# Patient Record
Sex: Female | Born: 1935 | Race: White | Hispanic: No | Smoking: Never smoker
Health system: Southern US, Community
[De-identification: ages and names within clinical notes are randomized; demographics above are authoritative.]

## PROBLEM LIST (undated history)

## (undated) DIAGNOSIS — I739 Peripheral vascular disease, unspecified: Secondary | ICD-10-CM

## (undated) DIAGNOSIS — E785 Hyperlipidemia, unspecified: Secondary | ICD-10-CM

## (undated) DIAGNOSIS — K219 Gastro-esophageal reflux disease without esophagitis: Secondary | ICD-10-CM

## (undated) DIAGNOSIS — I10 Essential (primary) hypertension: Secondary | ICD-10-CM

## (undated) DIAGNOSIS — I499 Cardiac arrhythmia, unspecified: Secondary | ICD-10-CM

## (undated) HISTORY — PX: ABDOMINAL HYSTERECTOMY: SHX81

## (undated) HISTORY — PX: CHOLECYSTECTOMY: SHX55

## (undated) HISTORY — PX: COLONOSCOPY: SHX174

---

## 2005-06-22 ENCOUNTER — Ambulatory Visit: Payer: Self-pay | Admitting: Gastroenterology

## 2005-06-30 ENCOUNTER — Ambulatory Visit: Payer: Self-pay | Admitting: Gastroenterology

## 2005-07-21 ENCOUNTER — Ambulatory Visit: Payer: Self-pay | Admitting: General Surgery

## 2005-09-20 ENCOUNTER — Ambulatory Visit: Payer: Self-pay | Admitting: Ophthalmology

## 2005-09-22 ENCOUNTER — Ambulatory Visit: Payer: Self-pay | Admitting: Ophthalmology

## 2006-01-12 ENCOUNTER — Ambulatory Visit: Payer: Self-pay | Admitting: Gastroenterology

## 2006-03-16 ENCOUNTER — Ambulatory Visit: Payer: Self-pay | Admitting: Unknown Physician Specialty

## 2006-08-22 ENCOUNTER — Ambulatory Visit: Payer: Self-pay | Admitting: Family Medicine

## 2007-02-21 ENCOUNTER — Ambulatory Visit: Payer: Self-pay

## 2007-04-13 ENCOUNTER — Ambulatory Visit: Payer: Self-pay | Admitting: Neurosurgery

## 2007-10-09 ENCOUNTER — Ambulatory Visit: Payer: Self-pay | Admitting: Family Medicine

## 2008-04-01 ENCOUNTER — Ambulatory Visit: Payer: Self-pay | Admitting: Family Medicine

## 2008-06-24 ENCOUNTER — Ambulatory Visit: Payer: Self-pay | Admitting: Internal Medicine

## 2008-09-07 ENCOUNTER — Ambulatory Visit: Payer: Self-pay | Admitting: Internal Medicine

## 2008-09-12 ENCOUNTER — Ambulatory Visit: Payer: Self-pay | Admitting: Family Medicine

## 2008-09-13 ENCOUNTER — Ambulatory Visit: Payer: Self-pay | Admitting: Family Medicine

## 2008-10-10 ENCOUNTER — Ambulatory Visit: Payer: Self-pay | Admitting: Family Medicine

## 2008-11-19 ENCOUNTER — Ambulatory Visit: Payer: Self-pay | Admitting: Gastroenterology

## 2009-10-13 ENCOUNTER — Ambulatory Visit: Payer: Self-pay | Admitting: Family Medicine

## 2009-12-23 ENCOUNTER — Ambulatory Visit: Payer: Self-pay | Admitting: Internal Medicine

## 2010-10-15 ENCOUNTER — Ambulatory Visit: Payer: Self-pay | Admitting: Family Medicine

## 2011-10-21 ENCOUNTER — Ambulatory Visit: Payer: Self-pay | Admitting: Family Medicine

## 2012-01-25 ENCOUNTER — Ambulatory Visit: Payer: Self-pay | Admitting: Unknown Physician Specialty

## 2012-10-24 ENCOUNTER — Ambulatory Visit: Payer: Self-pay | Admitting: Family Medicine

## 2013-06-25 ENCOUNTER — Ambulatory Visit: Payer: Self-pay | Admitting: Physical Medicine and Rehabilitation

## 2013-10-25 ENCOUNTER — Ambulatory Visit: Payer: Self-pay | Admitting: Family Medicine

## 2014-01-31 ENCOUNTER — Ambulatory Visit: Payer: Self-pay | Admitting: Gastroenterology

## 2014-04-18 DIAGNOSIS — E782 Mixed hyperlipidemia: Secondary | ICD-10-CM | POA: Insufficient documentation

## 2014-05-06 ENCOUNTER — Emergency Department: Payer: Self-pay | Admitting: Emergency Medicine

## 2014-05-06 LAB — CBC WITH DIFFERENTIAL/PLATELET
BASOS PCT: 0.9 %
Basophil #: 0.1 10*3/uL (ref 0.0–0.1)
EOS PCT: 0.9 %
Eosinophil #: 0.1 10*3/uL (ref 0.0–0.7)
HCT: 43.6 % (ref 35.0–47.0)
HGB: 14.7 g/dL (ref 12.0–16.0)
Lymphocyte #: 2.3 10*3/uL (ref 1.0–3.6)
Lymphocyte %: 32.7 %
MCH: 31.4 pg (ref 26.0–34.0)
MCHC: 33.7 g/dL (ref 32.0–36.0)
MCV: 93 fL (ref 80–100)
MONO ABS: 0.5 x10 3/mm (ref 0.2–0.9)
Monocyte %: 7 %
NEUTROS PCT: 58.5 %
Neutrophil #: 4.1 10*3/uL (ref 1.4–6.5)
Platelet: 246 10*3/uL (ref 150–440)
RBC: 4.68 10*6/uL (ref 3.80–5.20)
RDW: 13.4 % (ref 11.5–14.5)
WBC: 6.9 10*3/uL (ref 3.6–11.0)

## 2014-05-06 LAB — BASIC METABOLIC PANEL
Anion Gap: 13 (ref 7–16)
BUN: 16 mg/dL (ref 7–18)
Calcium, Total: 9.4 mg/dL (ref 8.5–10.1)
Chloride: 104 mmol/L (ref 98–107)
Co2: 24 mmol/L (ref 21–32)
Creatinine: 1.08 mg/dL (ref 0.60–1.30)
EGFR (Non-African Amer.): 50 — ABNORMAL LOW
GFR CALC AF AMER: 58 — AB
Glucose: 99 mg/dL (ref 65–99)
Osmolality: 282 (ref 275–301)
POTASSIUM: 4.1 mmol/L (ref 3.5–5.1)
Sodium: 141 mmol/L (ref 136–145)

## 2014-05-06 LAB — TROPONIN I: Troponin-I: <0.02 ng/mL

## 2014-05-06 LAB — URINALYSIS, COMPLETE
BILIRUBIN, UR: NEGATIVE
BLOOD: NEGATIVE
Bacteria: NONE SEEN
GLUCOSE, UR: NEGATIVE mg/dL (ref 0–75)
Ketone: NEGATIVE
Nitrite: NEGATIVE
PROTEIN: NEGATIVE
Ph: 5 (ref 4.5–8.0)
RBC,UR: NONE SEEN /HPF (ref 0–5)
Specific Gravity: 1.004 (ref 1.003–1.030)
WBC UR: 1 /HPF (ref 0–5)

## 2014-06-07 DIAGNOSIS — E538 Deficiency of other specified B group vitamins: Secondary | ICD-10-CM | POA: Insufficient documentation

## 2014-06-07 DIAGNOSIS — R7301 Impaired fasting glucose: Secondary | ICD-10-CM | POA: Insufficient documentation

## 2014-07-05 DIAGNOSIS — M5412 Radiculopathy, cervical region: Secondary | ICD-10-CM | POA: Insufficient documentation

## 2014-10-30 ENCOUNTER — Ambulatory Visit: Payer: Self-pay | Admitting: Family Medicine

## 2014-11-08 DEATH — deceased

## 2014-11-19 DIAGNOSIS — I071 Rheumatic tricuspid insufficiency: Secondary | ICD-10-CM | POA: Insufficient documentation

## 2014-11-19 DIAGNOSIS — I340 Nonrheumatic mitral (valve) insufficiency: Secondary | ICD-10-CM | POA: Insufficient documentation

## 2015-02-28 ENCOUNTER — Ambulatory Visit
Admit: 2015-02-28 | Disposition: A | Discharge status: Home / Self Care | Payer: Self-pay | Attending: Physical Medicine and Rehabilitation | Admitting: Physical Medicine and Rehabilitation

## 2015-04-04 ENCOUNTER — Other Ambulatory Visit: Payer: Self-pay | Admitting: Family Medicine

## 2015-04-04 DIAGNOSIS — R1011 Right upper quadrant pain: Secondary | ICD-10-CM

## 2015-04-09 ENCOUNTER — Ambulatory Visit
Admission: RE | Admit: 2015-04-09 | Discharge: 2015-04-09 | Disposition: A | Discharge status: Home / Self Care | Payer: Medicare Other | Source: Ambulatory Visit | Attending: Family Medicine | Admitting: Family Medicine

## 2015-04-09 DIAGNOSIS — R140 Abdominal distension (gaseous): Secondary | ICD-10-CM | POA: Diagnosis not present

## 2015-04-09 DIAGNOSIS — R1011 Right upper quadrant pain: Secondary | ICD-10-CM

## 2015-04-09 DIAGNOSIS — R110 Nausea: Secondary | ICD-10-CM | POA: Diagnosis not present

## 2015-04-09 DIAGNOSIS — R1013 Epigastric pain: Secondary | ICD-10-CM | POA: Diagnosis not present

## 2015-04-09 HISTORY — DX: Essential (primary) hypertension: I10

## 2015-04-09 MED ORDER — IOHEXOL 300 MG/ML  SOLN
85.0000 mL | Freq: Once | INTRAMUSCULAR | Status: AC | PRN
  Administered 2015-04-09: 100 mL via INTRAVENOUS

## 2015-04-18 DIAGNOSIS — I10 Essential (primary) hypertension: Secondary | ICD-10-CM | POA: Insufficient documentation

## 2015-05-22 IMAGING — CT CT HEAD WITHOUT CONTRAST
1 series · 16 of 30 positions shown, 20 images · non-contrast
Comparison: None.

CLINICAL DATA: Dizziness worse with standing

EXAM:
CT HEAD WITHOUT CONTRAST
TECHNIQUE: Contiguous axial images were obtained from the base of the skull
through the vertex without intravenous contrast.

[Series 2: head wo · axial · 0.41mm/px · z∈[-46,+89]mm · 16 of 30 slices shown, 20 images]
[im 2/30  brain]
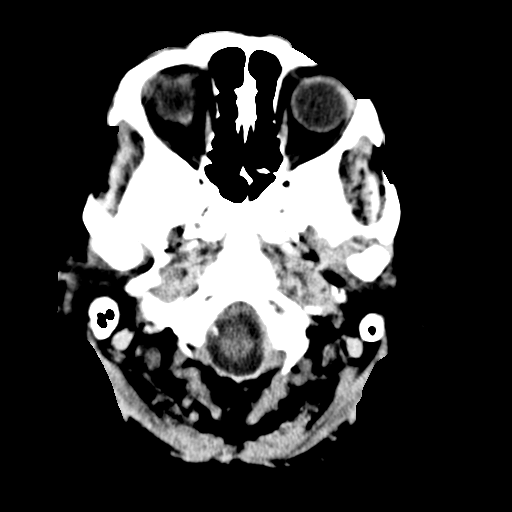
[im 2/30  bone]
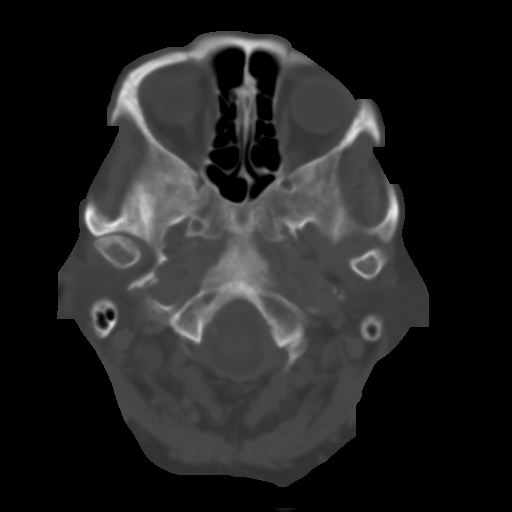
[im 4/30  brain]
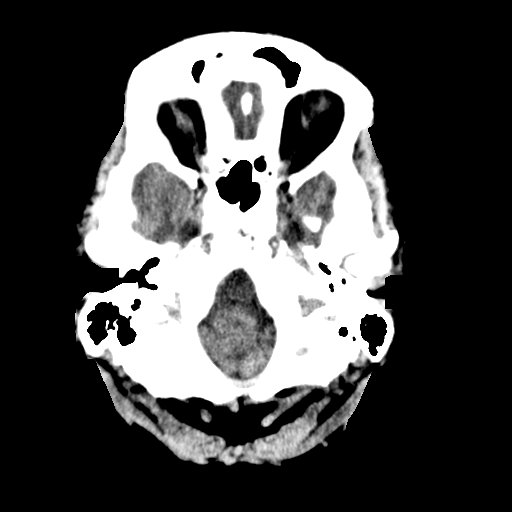
[im 6/30  brain]
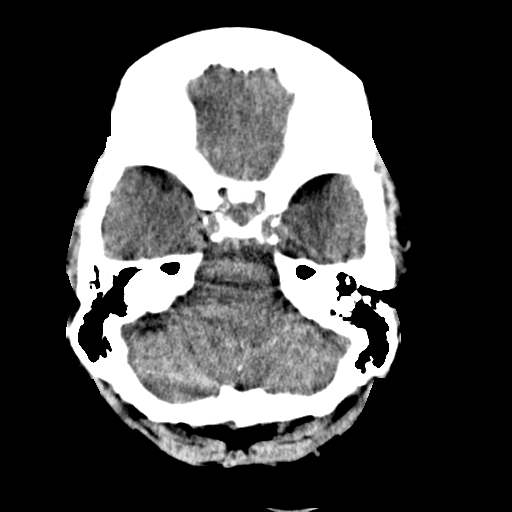
[im 8/30  brain]
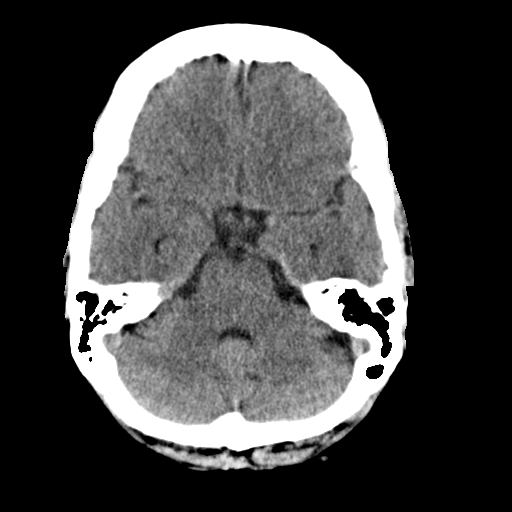
[im 9/30  brain]
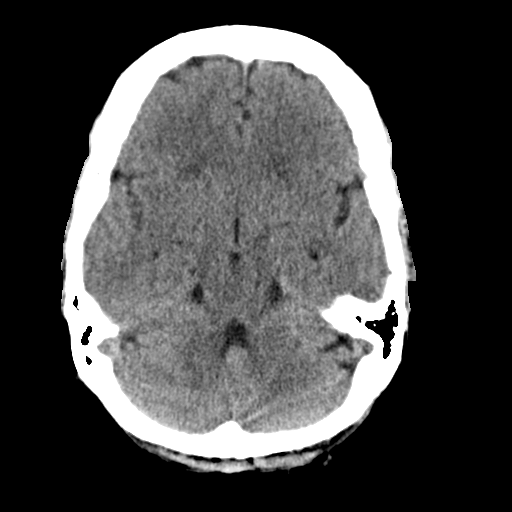
[im 9/30  bone]
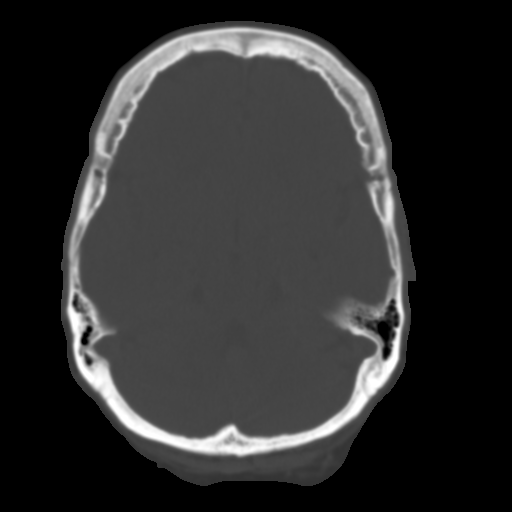
[im 11/30  brain]
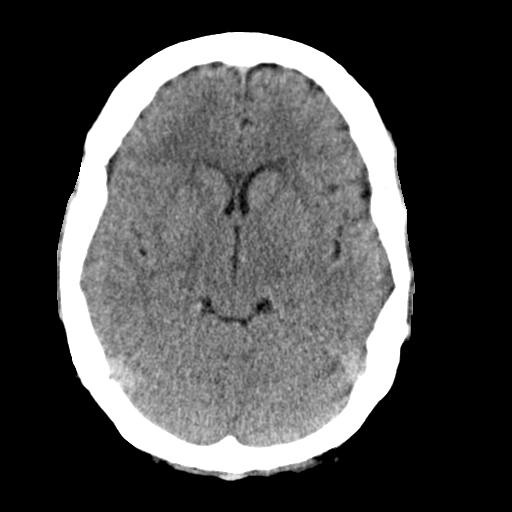
[im 13/30  brain]
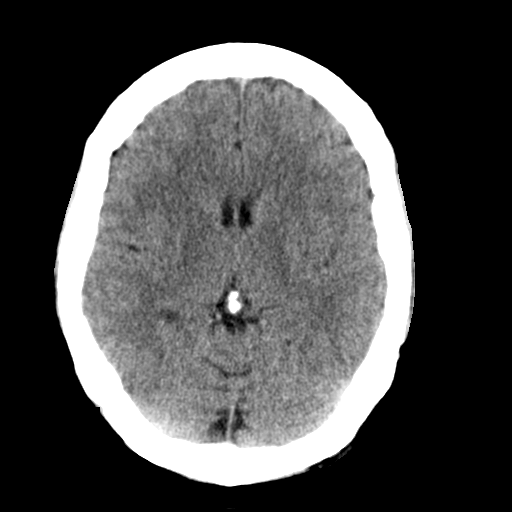
[im 15/30  brain]
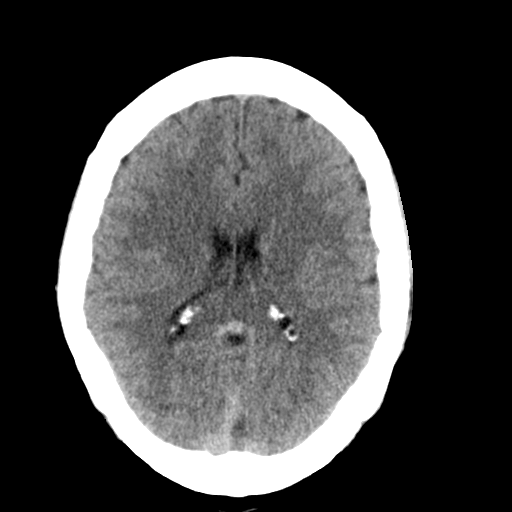
[im 16/30  brain]
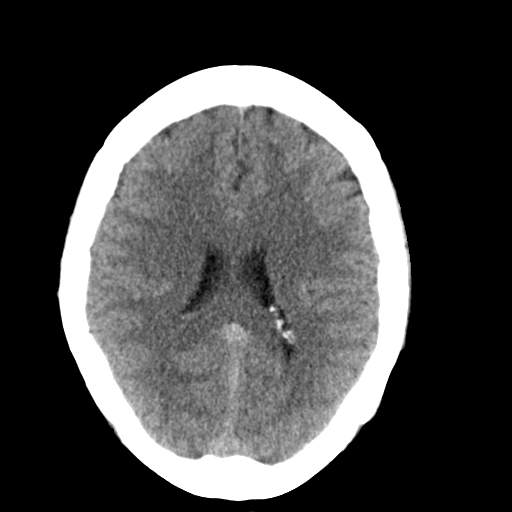
[im 16/30  bone]
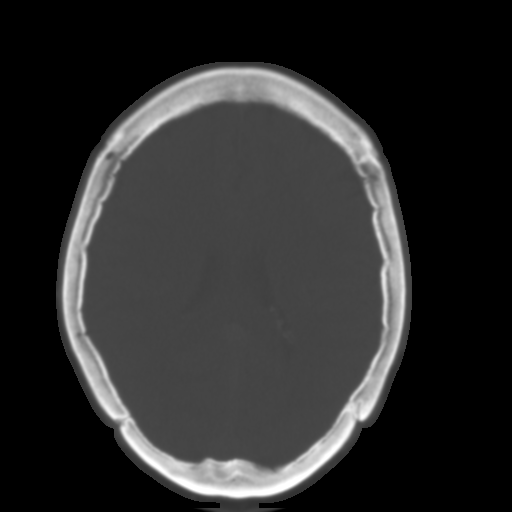
[im 18/30  brain]
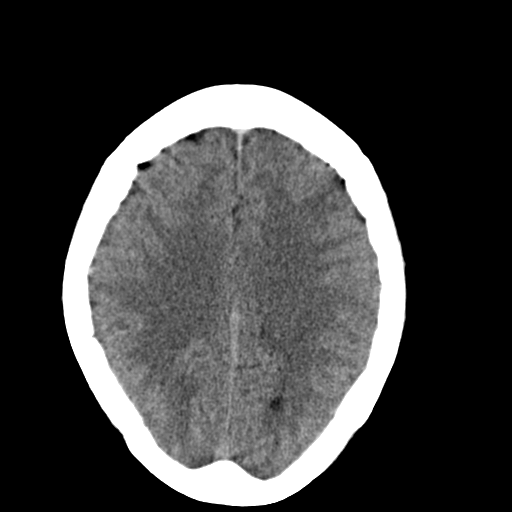
[im 20/30  brain]
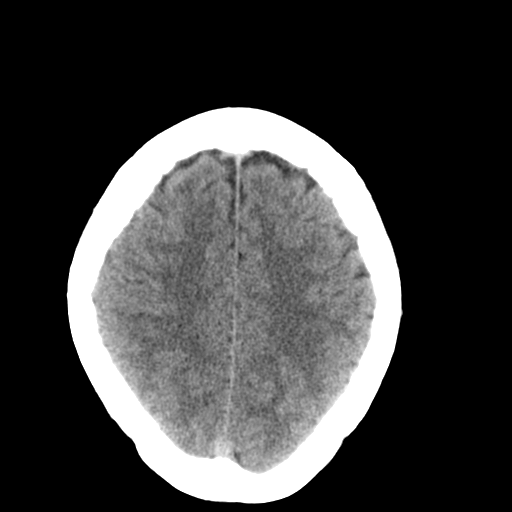
[im 22/30  brain]
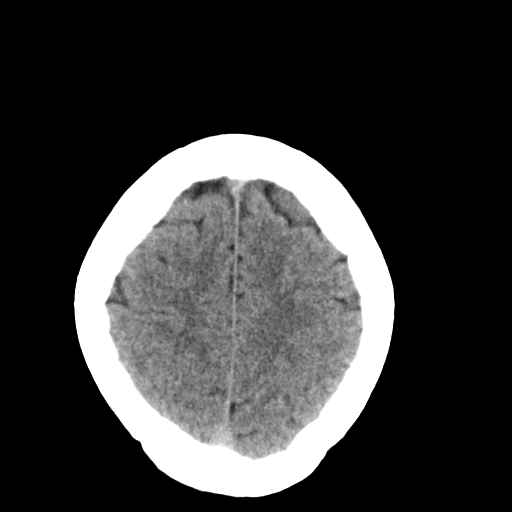
[im 23/30  brain]
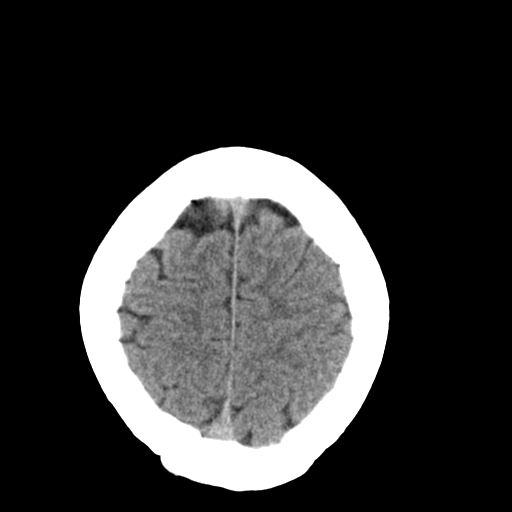
[im 23/30  bone]
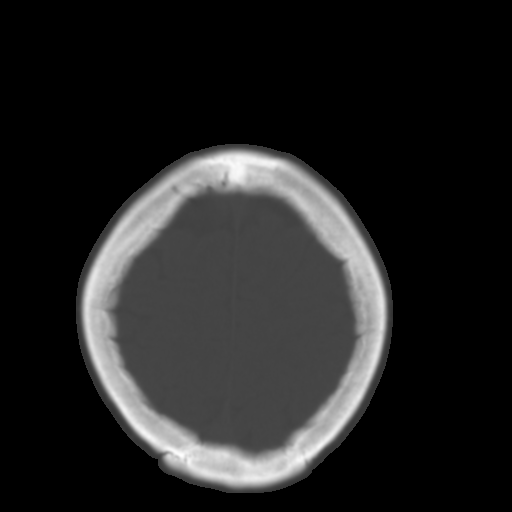
[im 25/30  brain]
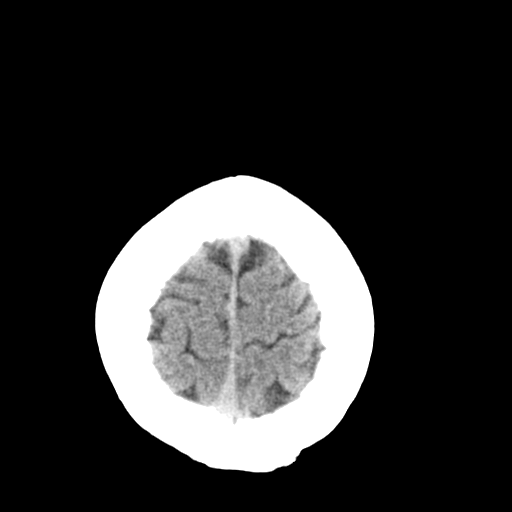
[im 27/30  brain]
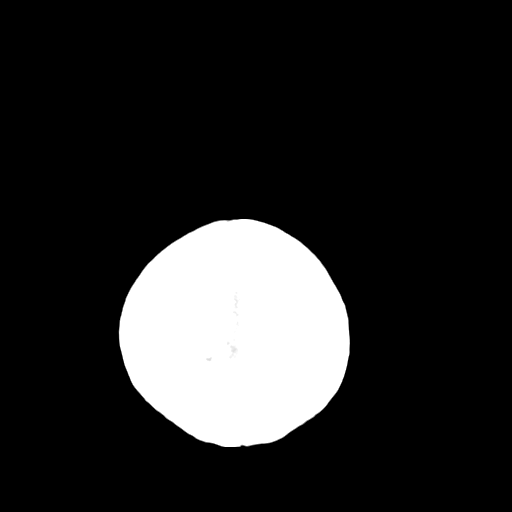
[im 29/30  brain]
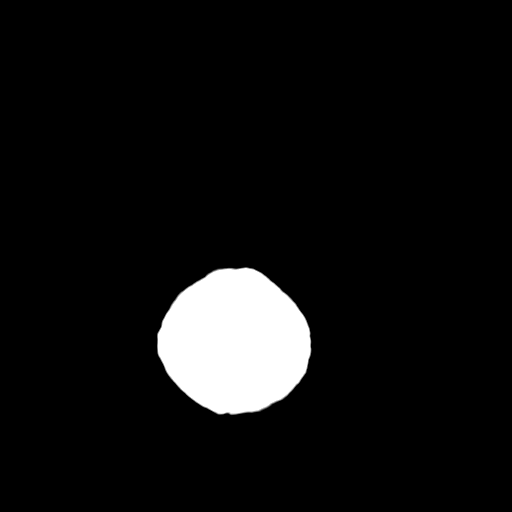

[16 of 30 positions shown; findings below may reference images not displayed]

FINDINGS: There is no evidence of mass effect, midline shift or extra-axial
fluid collections. There is no evidence of a space-occupying lesion
or intracranial hemorrhage. There is no evidence of a cortical-based
area of acute infarction.

The ventricles and sulci are appropriate for the patient's age. The
basal cisterns are patent.

Visualized portions of the orbits are unremarkable. The visualized
portions of the paranasal sinuses and mastoid air cells are
unremarkable.

The osseous structures are unremarkable.
IMPRESSION: No acute intracranial pathology.

## 2015-06-06 DIAGNOSIS — N3281 Overactive bladder: Secondary | ICD-10-CM | POA: Insufficient documentation

## 2015-08-22 ENCOUNTER — Ambulatory Visit
Admission: EM | Admit: 2015-08-22 | Discharge: 2015-08-22 | Disposition: A | Discharge status: Home / Self Care | Payer: Medicare Other | Attending: Family Medicine | Admitting: Family Medicine

## 2015-08-22 ENCOUNTER — Encounter: Payer: Self-pay | Admitting: Emergency Medicine

## 2015-08-22 ENCOUNTER — Ambulatory Visit: Payer: Medicare Other

## 2015-08-22 DIAGNOSIS — M25561 Pain in right knee: Secondary | ICD-10-CM

## 2015-08-22 NOTE — ED Notes (Signed)
Patient c/o right knee pain and swelling for a week.  Patient denies injury to knee.

## 2015-08-22 NOTE — ED Provider Notes (Signed)
CSN: [id number]     Arrival date & time 08/22/15  1144 History   First MD Initiated Contact with Patient 08/22/15 1222     Chief Complaint  Patient presents with  . Knee Pain   (Consider location/radiation/quality/duration/timing/severity/associated sxs/prior Treatment) Patient is a 80 y.o. female presenting with knee pain. The history is provided by the patient.  Knee Pain Location:  Knee Injury: no   Knee location:  R knee Pain details:    Quality:  Dull   Radiates to:  Does not radiate   Severity:  Moderate   Onset quality:  Sudden   Duration:  1 week   Timing:  Constant Chronicity:  New Dislocation: no   Foreign body present:  No foreign bodies Prior injury to area:  No Relieved by:  NSAIDs Associated symptoms: swelling   Associated symptoms: no back pain, no fatigue, no fever, no itching, no muscle weakness, no neck pain, no numbness, no stiffness and no tingling   Risk factors: no concern for non-accidental trauma, no frequent fractures, no known bone disorder and no recent illness     Past Medical History  Diagnosis Date  . Hypertension    Past Surgical History  Procedure Laterality Date  . Abdominal hysterectomy    . Cholecystectomy     History reviewed. No pertinent family history. Social History  Substance Use Topics  . Smoking status: Never Smoker   . Smokeless tobacco: None  . Alcohol Use: No   OB History    No data available     Review of Systems  Constitutional: Negative for fever and fatigue.  Musculoskeletal: Negative for back pain, stiffness and neck pain.  Skin: Negative for itching.    Allergies  Review of patient's allergies indicates no known allergies.  Home Medications   Prior to Admission medications   Medication Sig Start Date End Date Taking? Authorizing Provider  aspirin 81 MG tablet Take 81 mg by mouth daily.   Yes Historical Provider, MD  CYANOCOBALAMIN IJ Inject 1,000 mcg as directed every 30 (thirty) days.   Yes  Historical Provider, MD  esomeprazole (NEXIUM) 40 MG capsule Take 40 mg by mouth daily at 12 noon.   Yes Historical Provider, MD  fluorouracil (EFUDEX) 5 % cream Apply 1 application topically 2 (two) times daily.   Yes Historical Provider, MD  FLUoxetine (PROZAC) 10 MG capsule Take 10 mg by mouth daily.   Yes Historical Provider, MD  fluticasone (FLONASE) 50 MCG/ACT nasal spray Place 2 sprays into both nostrils daily.   Yes Historical Provider, MD  gabapentin (NEURONTIN) 300 MG capsule Take 300 mg by mouth at bedtime.   Yes Historical Provider, MD  oxybutynin (DITROPAN-XL) 5 MG 24 hr tablet Take 5 mg by mouth at bedtime.   Yes Historical Provider, MD  valACYclovir (VALTREX) 1000 MG tablet Take 1,000 mg by mouth as needed.   Yes Historical Provider, MD   Meds Ordered and Administered this Visit  Medications - No data to display  BP 124/75 mmHg  Pulse 72  Temp(Src) 98.1 F (36.7 C) (Oral)  Resp 16  Ht 5\' 7"  (1.702 m)  Wt 178 lb (80.74 kg)  BMI 27.87 kg/m2  SpO2 95%  LMP 04/09/1975 No data found.   Physical Exam  Constitutional: She appears well-developed and well-nourished. No distress.  Musculoskeletal:       Right knee: She exhibits swelling. She exhibits normal range of motion, no effusion, no ecchymosis, no deformity, no laceration, no erythema, normal alignment, no  LCL laxity and normal patellar mobility. Tenderness found. Medial joint line tenderness noted.  Skin: She is not diaphoretic.  Nursing note and vitals reviewed.   ED Course  Procedures (including critical care time)  Labs Review Labs Reviewed - No data to display  Imaging Review Dg Knee Complete 4 Views Right  08/22/2015  CLINICAL DATA:  Right knee pain and swelling for 1 week. No known injury. EXAM: RIGHT KNEE - COMPLETE 4+ VIEW COMPARISON:  None. FINDINGS: There is no evidence of fracture, dislocation, or joint effusion. There is no evidence of joint space narrowing or degenerative spurring. Mild  chondrocalcinosis noted. Soft tissues are unremarkable. IMPRESSION: No acute findings. Mild chondrocalcinosis noted. No other signs of arthropathy identified. Electronically Signed   By: Earle Gell M.D.   On: 08/22/2015 12:57     Visual Acuity Review  Right Eye Distance:   Left Eye Distance:   Bilateral Distance:    Right Eye Near:   Left Eye Near:    Bilateral Near:         MDM   1. Knee pain, right   (likely secondary to chondrocalcinosis)  1. x-ray results (negative for acute findings) and diagnosis reviewed with patient 2. rx as per orders above; reviewed possible side effects, interactions, risks and benefits  3. Recommend supportive treatment with otc analgesics prn, rest, ice 4. Follow prn if symptoms worsen or don't improve    Norval Gable, MD 08/22/15 [phone]

## 2015-09-15 DIAGNOSIS — M25511 Pain in right shoulder: Secondary | ICD-10-CM

## 2015-09-15 DIAGNOSIS — G8929 Other chronic pain: Secondary | ICD-10-CM | POA: Insufficient documentation

## 2015-10-16 DIAGNOSIS — G8929 Other chronic pain: Secondary | ICD-10-CM | POA: Insufficient documentation

## 2015-10-16 DIAGNOSIS — M25561 Pain in right knee: Secondary | ICD-10-CM

## 2015-12-29 DIAGNOSIS — M47812 Spondylosis without myelopathy or radiculopathy, cervical region: Secondary | ICD-10-CM | POA: Insufficient documentation

## 2016-02-25 DIAGNOSIS — I493 Ventricular premature depolarization: Secondary | ICD-10-CM | POA: Insufficient documentation

## 2016-06-28 DIAGNOSIS — M4316 Spondylolisthesis, lumbar region: Secondary | ICD-10-CM | POA: Insufficient documentation

## 2016-06-28 DIAGNOSIS — M48061 Spinal stenosis, lumbar region without neurogenic claudication: Secondary | ICD-10-CM | POA: Insufficient documentation

## 2016-07-05 DIAGNOSIS — M7521 Bicipital tendinitis, right shoulder: Secondary | ICD-10-CM | POA: Insufficient documentation

## 2016-07-05 DIAGNOSIS — M67911 Unspecified disorder of synovium and tendon, right shoulder: Secondary | ICD-10-CM | POA: Insufficient documentation

## 2016-07-05 DIAGNOSIS — M47817 Spondylosis without myelopathy or radiculopathy, lumbosacral region: Secondary | ICD-10-CM | POA: Insufficient documentation

## 2016-07-05 DIAGNOSIS — M7918 Myalgia, other site: Secondary | ICD-10-CM | POA: Insufficient documentation

## 2016-11-28 ENCOUNTER — Ambulatory Visit
Admission: EM | Admit: 2016-11-28 | Discharge: 2016-11-28 | Disposition: A | Discharge status: Home / Self Care | Payer: Medicare Other | Attending: Family Medicine | Admitting: Family Medicine

## 2016-11-28 ENCOUNTER — Encounter: Payer: Self-pay | Admitting: Gynecology

## 2016-11-28 DIAGNOSIS — T148XXA Other injury of unspecified body region, initial encounter: Secondary | ICD-10-CM

## 2016-11-28 DIAGNOSIS — W19XXXA Unspecified fall, initial encounter: Secondary | ICD-10-CM

## 2016-11-28 MED ORDER — TRAMADOL HCL 50 MG PO TABS: 50.0000 mg | ORAL_TABLET | Freq: Three times a day (TID) | ORAL | 0 refills | Status: DC | PRN

## 2016-11-28 NOTE — ED Triage Notes (Signed)
Patient clo fallen foward at home on the side walk. Per patient  Abrasion on right arm / right chest bruise and pain / left rib cage bruise and pain

## 2016-11-28 NOTE — Discharge Instructions (Signed)
Tramadol as needed for pain.  Take care  Dr. Lacinda Axon

## 2016-11-28 NOTE — ED Provider Notes (Signed)
[facility]    CSN: [id number] Arrival date & time: 11/28/16  1208  History   Chief Complaint Chief Complaint  Patient presents with  . Fall   HPI  82 year old female presents for evaluation after suffering a fall yesterday.  Patient states that she fail outside on the sidewalk at 3:30 yesterday. She states that she got off balance as she was carrying drinks and a travel back. She subsequently fell forward and landed on her arm and anterior chest. Patient suffered skin tears on her right hand/arm and has had some bruising on her right breast. She endorses mild pain. She cleaned the wounds. She has some associated rib discomfort as well. No reports of shortness of breath. No fever. No drainage from wounds. No other complaints or concerns at this time.  Past Medical History:  Diagnosis Date  . Hypertension    Past Surgical History:  Procedure Laterality Date  . ABDOMINAL HYSTERECTOMY    . CHOLECYSTECTOMY      OB History    No data available       Home Medications    Prior to Admission medications   Medication Sig Start Date End Date Taking? Authorizing Provider  aspirin 81 MG tablet Take 81 mg by mouth daily.   Yes Historical Provider, MD  CYANOCOBALAMIN IJ Inject 1,000 mcg as directed every 30 (thirty) days.   Yes Historical Provider, MD  esomeprazole (NEXIUM) 40 MG capsule Take 40 mg by mouth daily at 12 noon.   Yes Historical Provider, MD  fluorouracil (EFUDEX) 5 % cream Apply 1 application topically 2 (two) times daily.   Yes Historical Provider, MD  FLUoxetine (PROZAC) 10 MG capsule Take 10 mg by mouth daily.   Yes Historical Provider, MD  fluticasone (FLONASE) 50 MCG/ACT nasal spray Place 2 sprays into both nostrils daily.   Yes Historical Provider, MD  gabapentin (NEURONTIN) 300 MG capsule Take 300 mg by mouth at bedtime.   Yes Historical Provider, MD  oxybutynin (DITROPAN-XL) 5 MG 24 hr tablet Take 5 mg by mouth at bedtime.   Yes Historical Provider, MD    valACYclovir (VALTREX) 1000 MG tablet Take 1,000 mg by mouth as needed.   Yes Historical Provider, MD  traMADol (ULTRAM) 50 MG tablet Take 1 tablet (50 mg total) by mouth every 8 (eight) hours as needed. 11/28/16   Coral Spikes, DO    Family History No family history on file.  Social History Social History  Substance Use Topics  . Smoking status: Never Smoker  . Smokeless tobacco: Former Systems developer  . Alcohol use No     Allergies   Patient has no known allergies.   Review of Systems Review of Systems  Constitutional: Negative.   Musculoskeletal: Positive for arthralgias and myalgias.  Skin: Positive for wound.   Physical Exam Triage Vital Signs ED Triage Vitals  Enc Vitals Group     BP 11/28/16 1355 128/65     Pulse Rate 11/28/16 1355 73     Resp 11/28/16 1355 16     Temp 11/28/16 1355 99 F (37.2 C)     Temp Source 11/28/16 1355 Oral     SpO2 11/28/16 1355 97 %     Weight 11/28/16 1356 168 lb (76.2 kg)     Height 11/28/16 1356 5\' 7"  (1.702 m)     Head Circumference --      Peak Flow --      Pain Score 11/28/16 1358 5     Pain  Loc --      Pain Edu? --      Excl. in [place]? --     Updated Vital Signs BP 128/65 (BP Location: Left Arm)   Pulse 73   Temp 99 F (37.2 C) (Oral)   Resp 16   Ht 5\' 7"  (1.702 m)   Wt 168 lb (76.2 kg)   LMP 04/09/1975   SpO2 97%   BMI 26.31 kg/m   Physical Exam  Constitutional: She appears well-developed. No distress.  HENT:  Head: Normocephalic and atraumatic.  Eyes: Conjunctivae are normal.  Neck: Normal range of motion. Neck supple.  Cardiovascular: Normal rate and regular rhythm.   Pulmonary/Chest: Effort normal and breath sounds normal.  Skin:  Right dorsum of the hand - skin tear noted. 2 cm x 2.5  cm. No purulent drainage.  Forearm, volar aspect - skin tear, 3 cm by 3.5 cm. no drainage noted.  Bruising noted of the right breast.  Vitals reviewed.  UC Treatments / Results  Labs (all labs ordered are listed, but only  abnormal results are displayed) Labs Reviewed - No data to display  EKG  EKG Interpretation None      Radiology No results found.  Procedures Procedures (including critical care time)  Medications Ordered in UC Medications - No data to display   Initial Impression / Assessment and Plan / UC Course  I have reviewed the triage vital signs and the nursing notes.  Pertinent labs & imaging results that were available during my care of the patient were reviewed by me and considered in my medical decision making (see chart for details).    82 year old female presents for evaluation following a fall. Skin tears noted. Cleaned and dressed. No evidence of infection. Patient with no neurological deficits or head trauma. No indication for imaging. Tramadol as needed for pain.  Final Clinical Impressions(s) / UC Diagnoses   Final diagnoses:  Fall, initial encounter  Multiple skin tears   New Prescriptions Discharge Medication List as of 11/28/2016  2:53 PM    START taking these medications   Details  traMADol (ULTRAM) 50 MG tablet Take 1 tablet (50 mg total) by mouth every 8 (eight) hours as needed., Starting Sun 11/28/2016, [place], DO 11/28/16 1525

## 2016-12-03 ENCOUNTER — Encounter: Payer: Self-pay | Admitting: Emergency Medicine

## 2016-12-03 ENCOUNTER — Emergency Department: Payer: Medicare Other

## 2016-12-03 ENCOUNTER — Emergency Department
Admission: EM | Admit: 2016-12-03 | Discharge: 2016-12-03 | Disposition: A | Discharge status: Home / Self Care | Payer: Medicare Other | Attending: Emergency Medicine | Admitting: Emergency Medicine

## 2016-12-03 DIAGNOSIS — Z87891 Personal history of nicotine dependence: Secondary | ICD-10-CM | POA: Insufficient documentation

## 2016-12-03 DIAGNOSIS — I10 Essential (primary) hypertension: Secondary | ICD-10-CM | POA: Insufficient documentation

## 2016-12-03 DIAGNOSIS — M5431 Sciatica, right side: Secondary | ICD-10-CM

## 2016-12-03 DIAGNOSIS — M79604 Pain in right leg: Secondary | ICD-10-CM | POA: Diagnosis present

## 2016-12-03 DIAGNOSIS — M5441 Lumbago with sciatica, right side: Secondary | ICD-10-CM | POA: Insufficient documentation

## 2016-12-03 DIAGNOSIS — Z79899 Other long term (current) drug therapy: Secondary | ICD-10-CM | POA: Insufficient documentation

## 2016-12-03 MED ORDER — LIDOCAINE 5 % EX PTCH
MEDICATED_PATCH | CUTANEOUS | Status: AC
  Administered 2016-12-03: 1 via TRANSDERMAL
  Filled 2016-12-03: qty 1

## 2016-12-03 MED ORDER — LIDOCAINE 5 % EX PTCH
1.0000 | MEDICATED_PATCH | CUTANEOUS | Status: DC
  Administered 2016-12-03: 1 via TRANSDERMAL

## 2016-12-03 MED ORDER — LIDOCAINE 5 % EX PTCH: 1.0000 | MEDICATED_PATCH | CUTANEOUS | 0 refills | Status: DC

## 2016-12-03 NOTE — ED Provider Notes (Signed)
Time Seen: Approximately 1557  I have reviewed the triage notes  Chief Complaint: Leg Pain   History of Present Illness: Cynthia Conley is a 82 y.o. female who states that she's had approximately a right sided lower extremity pain now for the past week. Patient describes pain essentially circumferential from the knee distal. She has had a fall but denies any trauma to this region. She states she went to her primary physician and a "" didn't do nothing "". Patient had denies any weakness in her lower extremity though states the pain is worse with ambulation. She denies any pain localized to the right hip when she ambulates. She has had a history of low back discomfort. She denies any left-sided leg symptoms. She denies any upper extremity concerns. She denies any abdominal pain or cold extremities. She denies any increased pain with ambulation and states that she'll only with weightbearing that bothers her. She is able to ambulate without a lot of significant pain. She is concerned that she didn't have any x-rays of the area of her pain and describes that from being from the knee distal. She denies any risk factors for deep venous thrombosis such as recent surgery or stasis. The patient's somewhat vague historian.   Past Medical History:  Diagnosis Date  . Hypertension     There are no active problems to display for this patient.   Past Surgical History:  Procedure Laterality Date  . ABDOMINAL HYSTERECTOMY    . CHOLECYSTECTOMY      Past Surgical History:  Procedure Laterality Date  . ABDOMINAL HYSTERECTOMY    . CHOLECYSTECTOMY      Current Outpatient Rx  . Order #: [id number] Class: Historical Med  . Order #: [id number] Class: Historical Med  . Order #: [id number] Class: Historical Med  . Order #: [id number] Class: Historical Med  . Order #: [id number] Class: Historical Med  . Order #: [id number] Class: Historical Med  . Order #: [id number] Class: Historical Med  . Order #:  [id number] Class: Print  . Order #: [id number] Class: Historical Med  . Order #: [id number] Class: Print  . Order #: [id number] Class: Historical Med    Allergies:  Patient has no known allergies.  Family History: No family history on file.  Social History: Social History  Substance Use Topics  . Smoking status: Never Smoker  . Smokeless tobacco: Former Systems developer  . Alcohol use No     Review of Systems:   10 point review of systems was performed and was otherwise negative:  Constitutional: No fever Eyes: No visual disturbances ENT: No sore throat, ear pain Cardiac: No chest pain Respiratory: No shortness of breath, wheezing, or stridor Abdomen: No abdominal pain, no vomiting, No diarrhea Endocrine: No weight loss, No night sweats Extremities: No peripheral edema, cyanosis Skin: No rashes, easy bruising Neurologic: No focal weakness, trouble with speech or swollowing. Patient denies any sensory deficits such as a bandlike region. She states her pain seems to be worse from the knee down especially with ambulation. She has had some low back discomfort and occasional pain in the right hip Urologic: No dysuria, Hematuria, or urinary frequency   Physical Exam:  ED Triage Vitals  Enc Vitals Group     BP 12/03/16 1359 (!) 157/132     Pulse Rate 12/03/16 1359 90     Resp 12/03/16 1359 16     Temp 12/03/16 1359 97.8 F (36.6 C)     Temp Source 12/03/16 1359 Oral     SpO2 12/03/16 1359 94 %  Weight 12/03/16 1357 168 lb (76.2 kg)     Height 12/03/16 1357 5\' 7"  (1.702 m)     Head Circumference --      Peak Flow --      Pain Score 12/03/16 1357 0     Pain Loc --      Pain Edu? --      Excl. in [place]? --     General: Awake , Alert , and Oriented times 3; GCS 15 Head: Normal cephalic , atraumatic Eyes: Pupils equal , round, reactive to light Nose/Throat: No nasal drainage, patent upper airway without erythema or exudate.  Neck: Supple, Full range of motion, No anterior adenopathy or  palpable thyroid masses Lungs: Clear to ascultation without wheezes , rhonchi, or rales Heart: Regular rate, regular rhythm without murmurs , gallops , or rubs Abdomen: Soft, non tender without rebound, guarding , or rigidity; bowel sounds positive and symmetric in all 4 quadrants. No organomegaly .        Extremities:Patient has no obvious reproducible pain to palpation with a negative Homans study. She has good 2+ dorsalis pedis and posterior tibial pulses. No obvious significant edema. Her knee is stable to Lachman testing. No signs of instability. She has some mild tenderness with inversion and eversion at the level of her right hip with a negative straight leg raise. Neurologic: normal ambulation, Motor symmetric without deficits, sensory intact Skin: warm, dry, no rashes    Radiology: "Dg Knee 2 Views Right  Result Date: 12/03/2016 CLINICAL DATA:  Acute right knee pain without known injury. EXAM: RIGHT KNEE - 1-2 VIEW COMPARISON:  Radiographs of August 22, 2015. FINDINGS: No evidence of fracture, dislocation, or joint effusion. Mild narrowing of medial joint space is noted. Soft tissues are unremarkable. IMPRESSION: Mild degenerative joint disease is noted medially. No acute abnormality seen in the right knee. Electronically Signed   By: Marijo Conception, M.D.   On: 12/03/2016 16:27   Dg Tibia/fibula Right  Result Date: 12/03/2016 CLINICAL DATA:  Right calf pain for 1 week without known injury. EXAM: RIGHT TIBIA AND FIBULA - 2 VIEW COMPARISON:  None. FINDINGS: There is no evidence of fracture or other focal bone lesions. Soft tissues are unremarkable. IMPRESSION: Normal right tibia and fibula. Electronically Signed   By: Marijo Conception, M.D.   On: 12/03/2016 16:28   US Venous Img Lower Unilateral Right  Result Date: 12/03/2016 CLINICAL DATA:  82 year old female with right lower extremity pain EXAM: RIGHT LOWER EXTREMITY VENOUS DOPPLER ULTRASOUND TECHNIQUE: Gray-scale sonography with graded  compression, as well as color Doppler and duplex ultrasound were performed to evaluate the lower extremity deep venous systems from the level of the common femoral vein and including the common femoral, femoral, profunda femoral, popliteal and calf veins including the posterior tibial, peroneal and gastrocnemius veins when visible. The superficial great saphenous vein was also interrogated. Spectral Doppler was utilized to evaluate flow at rest and with distal augmentation maneuvers in the common femoral, femoral and popliteal veins. COMPARISON:  None. FINDINGS: Contralateral Common Femoral Vein: Respiratory phasicity is normal and symmetric with the symptomatic side. No evidence of thrombus. Normal compressibility. Common Femoral Vein: No evidence of thrombus. Normal compressibility, respiratory phasicity and response to augmentation. Saphenofemoral Junction: No evidence of thrombus. Normal compressibility and flow on color Doppler imaging. Profunda Femoral Vein: No evidence of thrombus. Normal compressibility and flow on color Doppler imaging. Femoral Vein: No evidence of thrombus. Normal compressibility, respiratory phasicity and response to augmentation. Popliteal  Vein: No evidence of thrombus. Normal compressibility, respiratory phasicity and response to augmentation. Calf Veins: No evidence of thrombus. Normal compressibility and flow on color Doppler imaging. Superficial Great Saphenous Vein: No evidence of thrombus. Normal compressibility and flow on color Doppler imaging. Venous Reflux:  None. Other Findings:  None. IMPRESSION: No evidence of deep venous thrombosis. Electronically Signed   By: Jacqulynn Cadet M.D.   On: 12/03/2016 16:55  "  I personally reviewed the radiologic studies     ED Course:  Patient's stay here was uneventful and I felt her pain given the rather vague presentation may be [place] code-type discomfort. It sounds as though this was the intention of her primary physician and  she had a has referral to a "" back surgeon "". Patient does not exhibit any symptoms consistent with cauda equina syndrome. She had a lidocaine patch applied here in emergency department and I'll prescribe her lidocaine on an outpatient basis and we discussed continuing with ibuprofen and Tylenol round-the-clock before we tried narcotic pain medication. She was advised that the intention of her studies today was to make sure there wasn't any other reasons for her right leg discomfort, such as a blood clot in the right lower extremity and she requested x-rays from the knee down to her ankle which did not show any lytic lesions or any signs of sarcoma  Final Clinical Impression:  Final diagnoses:  Right leg pain  Sciatica of right side     Plan:  Outpatient " Discharge Medication List as of 12/03/2016  5:48 PM    START taking these medications   Details  lidocaine (LIDODERM) 5 % Place 1 patch onto the skin daily., Starting Fri 12/03/2016, Print      " Patient was advised to return immediately if condition worsens. Patient was advised to follow up with their primary care physician or other specialized physicians involved in their outpatient care. The patient and/or family member/power of attorney had laboratory results reviewed at the bedside. All questions and concerns were addressed and appropriate discharge instructions were distributed by the nursing staff.             Daymon Larsen, MD 12/03/16 [id number]

## 2016-12-03 NOTE — ED Notes (Signed)
Pt ambulatory to room, states R leg from knee down hurts while walking, front and back. States while sitting it doesn't hurt. States pain x 1 week. Fell Saturday but states R leg was already hurting her. Alert and oriented. Requesting x-ray. Swollen on top of R foot according to pt- more than normal per pt.

## 2016-12-03 NOTE — Discharge Instructions (Signed)
Please continue with over-the-counter ibuprofen and Tylenol as described. You can take 2-3 ibuprofen which is 400-600 mg every 6 hours as long as her stomach is doing okay. Any kind of irritation of the stomach or epigastric pain that he'll need to stop the ibuprofen. You can also take over-the-counter Tylenol every 8 hours to extra strength tablets which would be 1000 mg. The patches Applied every 24 hours for pain. Return here to the emergency department especially if you have leg weakness, difficulty with bladder or bowel function, bloody stool, fever or any other new concerns. Continue with your follow-up with the back surgeon.  Please return immediately if condition worsens. Please contact her primary physician or the physician you were given for referral. If you have any specialist physicians involved in her treatment and plan please also contact them. Thank you for using [facility].

## 2016-12-03 NOTE — ED Triage Notes (Signed)
C/O right knee, right calf, and right foot pain x 1 week.  Pain only with weight bearing.  Denies injury.

## 2017-01-10 DIAGNOSIS — M5416 Radiculopathy, lumbar region: Secondary | ICD-10-CM | POA: Insufficient documentation

## 2017-05-13 DIAGNOSIS — I480 Paroxysmal atrial fibrillation: Secondary | ICD-10-CM | POA: Insufficient documentation

## 2017-07-29 ENCOUNTER — Other Ambulatory Visit: Payer: Self-pay | Admitting: Family Medicine

## 2017-07-29 DIAGNOSIS — N6310 Unspecified lump in the right breast, unspecified quadrant: Secondary | ICD-10-CM

## 2017-08-01 ENCOUNTER — Other Ambulatory Visit: Payer: Self-pay | Admitting: Family Medicine

## 2017-08-01 DIAGNOSIS — N6310 Unspecified lump in the right breast, unspecified quadrant: Secondary | ICD-10-CM

## 2017-11-15 ENCOUNTER — Ambulatory Visit
Admission: RE | Admit: 2017-11-15 | Discharge: 2017-11-15 | Disposition: A | Discharge status: Home / Self Care | Payer: Medicare Other | Source: Ambulatory Visit | Attending: Family Medicine | Admitting: Family Medicine

## 2017-11-15 DIAGNOSIS — N6001 Solitary cyst of right breast: Secondary | ICD-10-CM | POA: Insufficient documentation

## 2017-11-15 DIAGNOSIS — N6310 Unspecified lump in the right breast, unspecified quadrant: Secondary | ICD-10-CM | POA: Diagnosis present

## 2017-12-19 IMAGING — DX DG KNEE 1-2V*R*
2 series · 2 of 2 positions shown · non-contrast
Comparison: Radiographs August 22, 2015.

CLINICAL DATA: Acute right knee pain without known injury.

EXAM:
RIGHT KNEE - 1-2 VIEW

[knee ap]
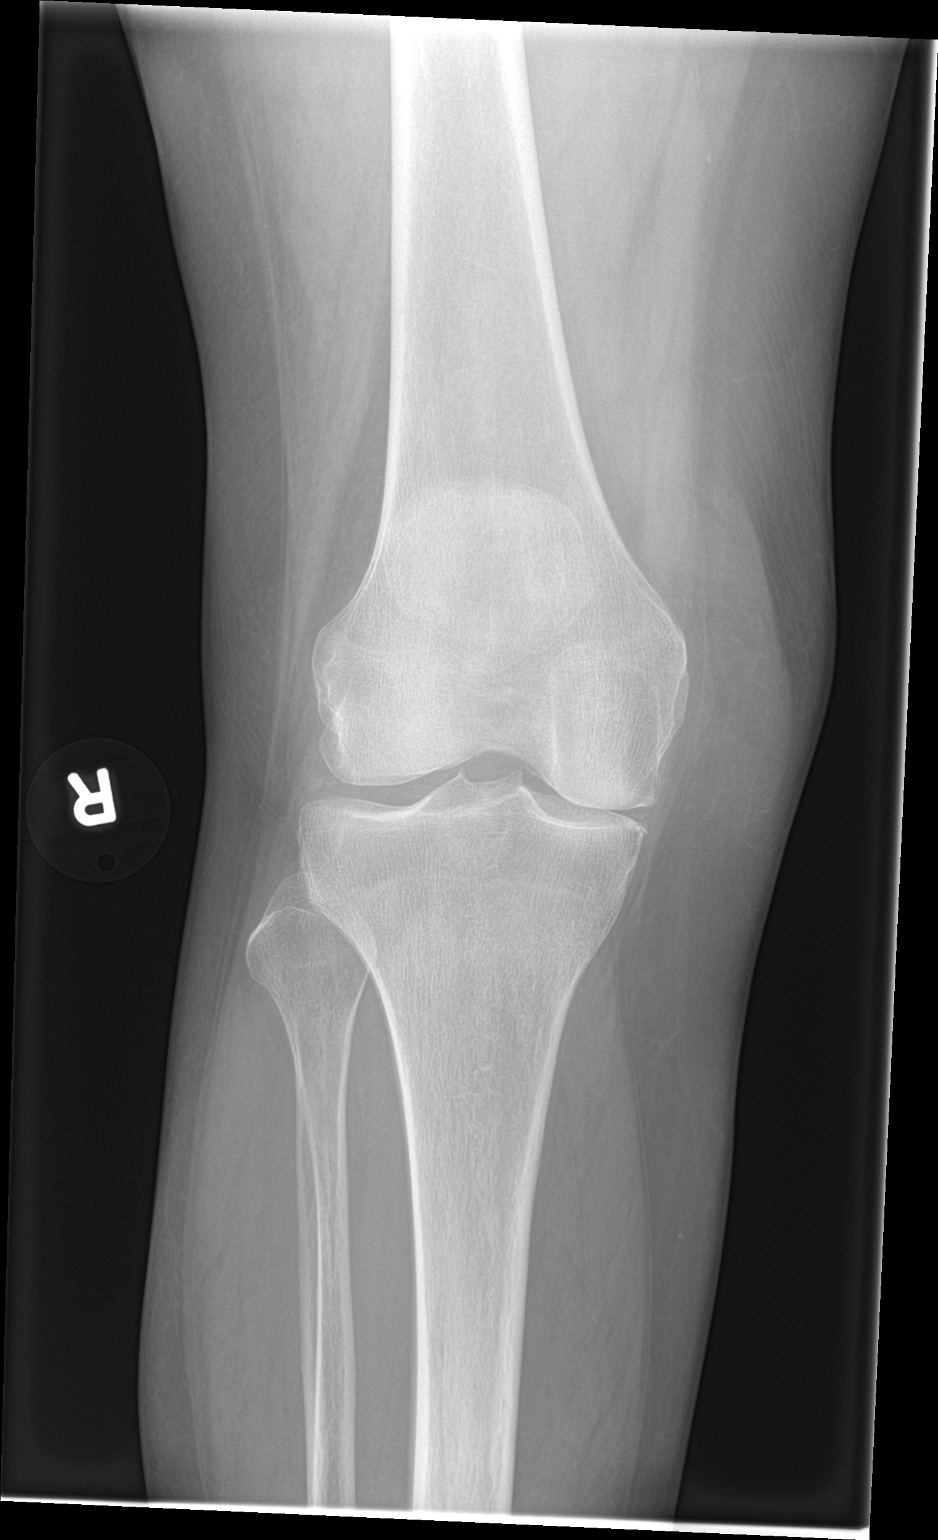

[knee lat]
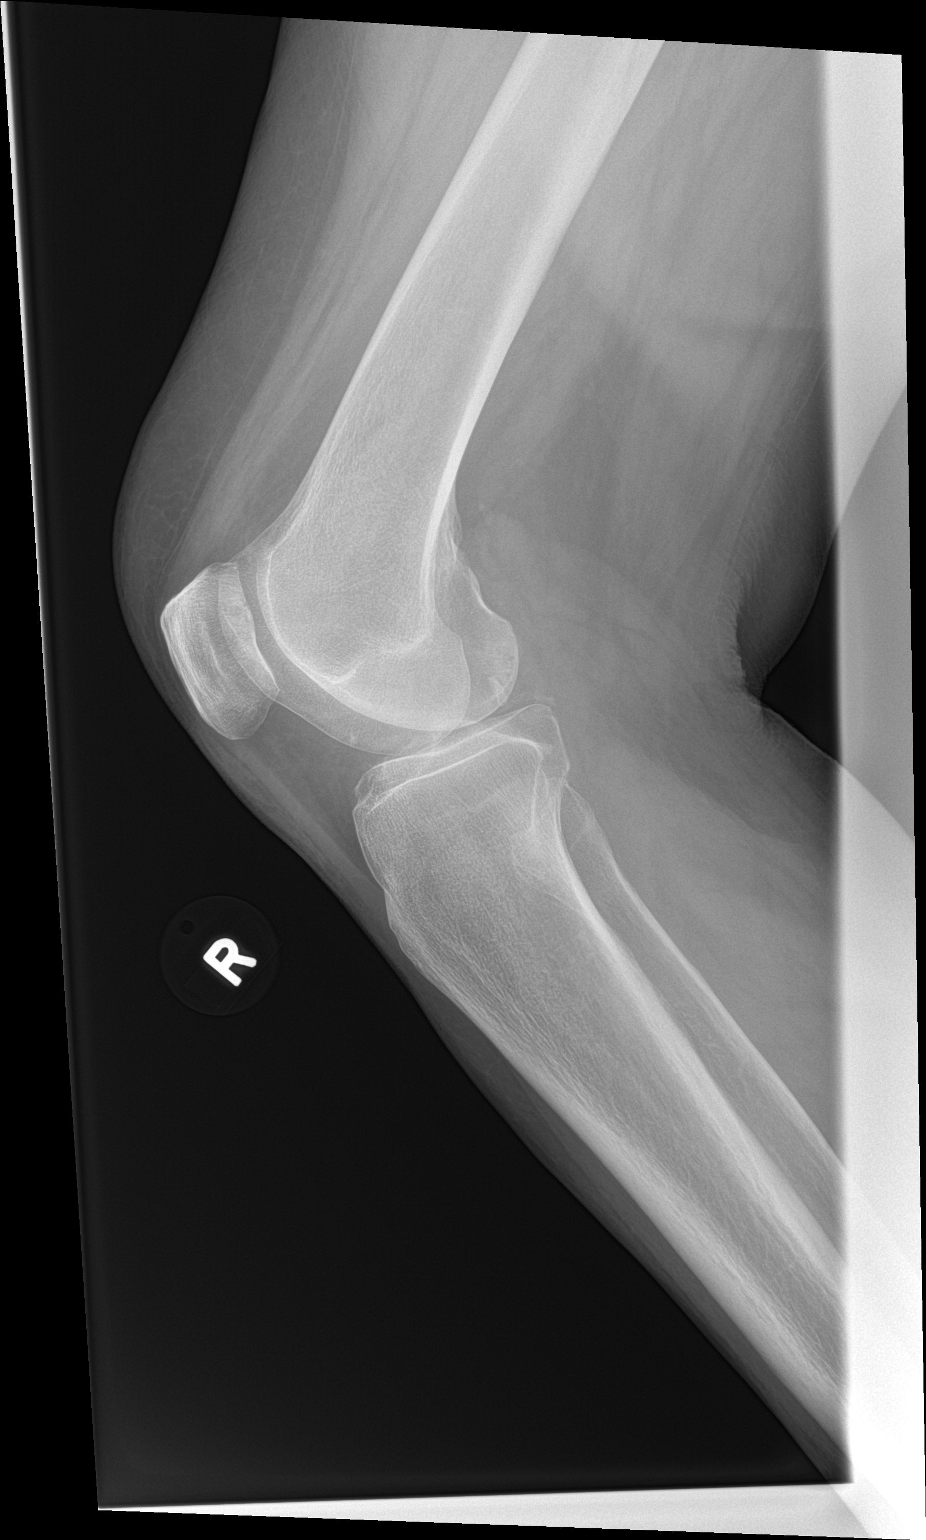

[2 of 2 positions shown; findings below may reference images not displayed]

FINDINGS: No evidence of fracture, dislocation, or joint effusion. Mild
narrowing of medial joint space is noted. Soft tissues are
unremarkable.
IMPRESSION: Mild degenerative joint disease is noted medially. No acute
abnormality seen in the right knee.

## 2017-12-19 IMAGING — DX DG TIBIA/FIBULA 2V*R*
2 series · 2 of 2 positions shown · non-contrast
Comparison: None.

CLINICAL DATA: Right calf pain for 1 week without known injury.

EXAM:
RIGHT TIBIA AND FIBULA - 2 VIEW

[tibia ap]
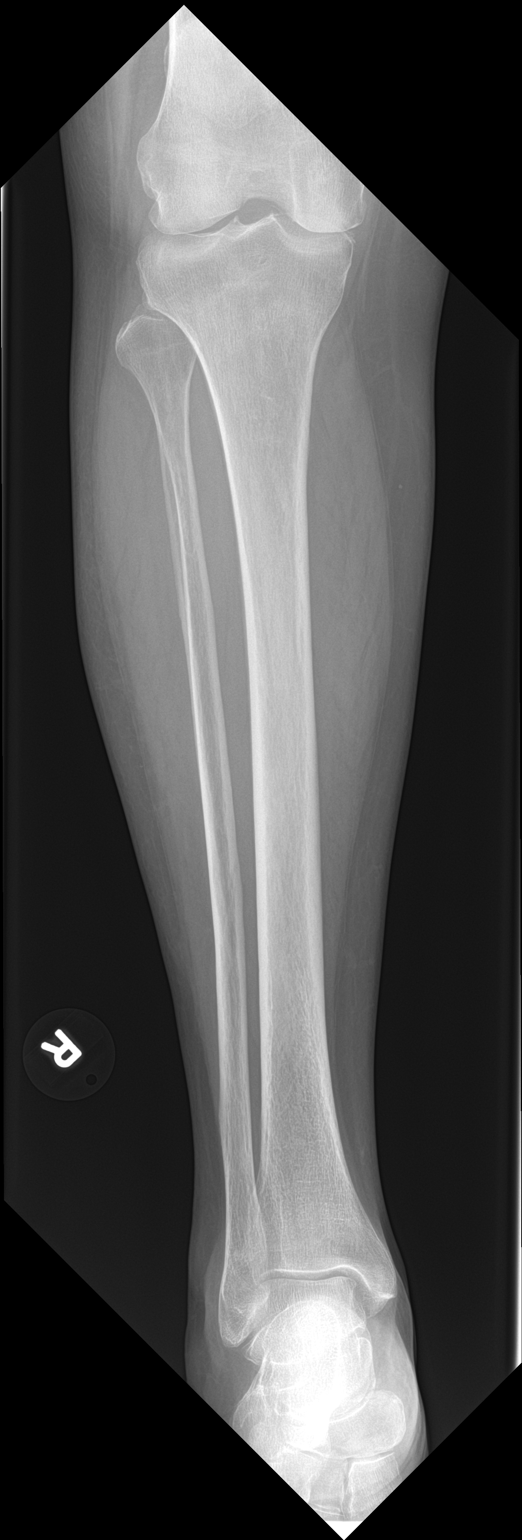

[tibia lat]
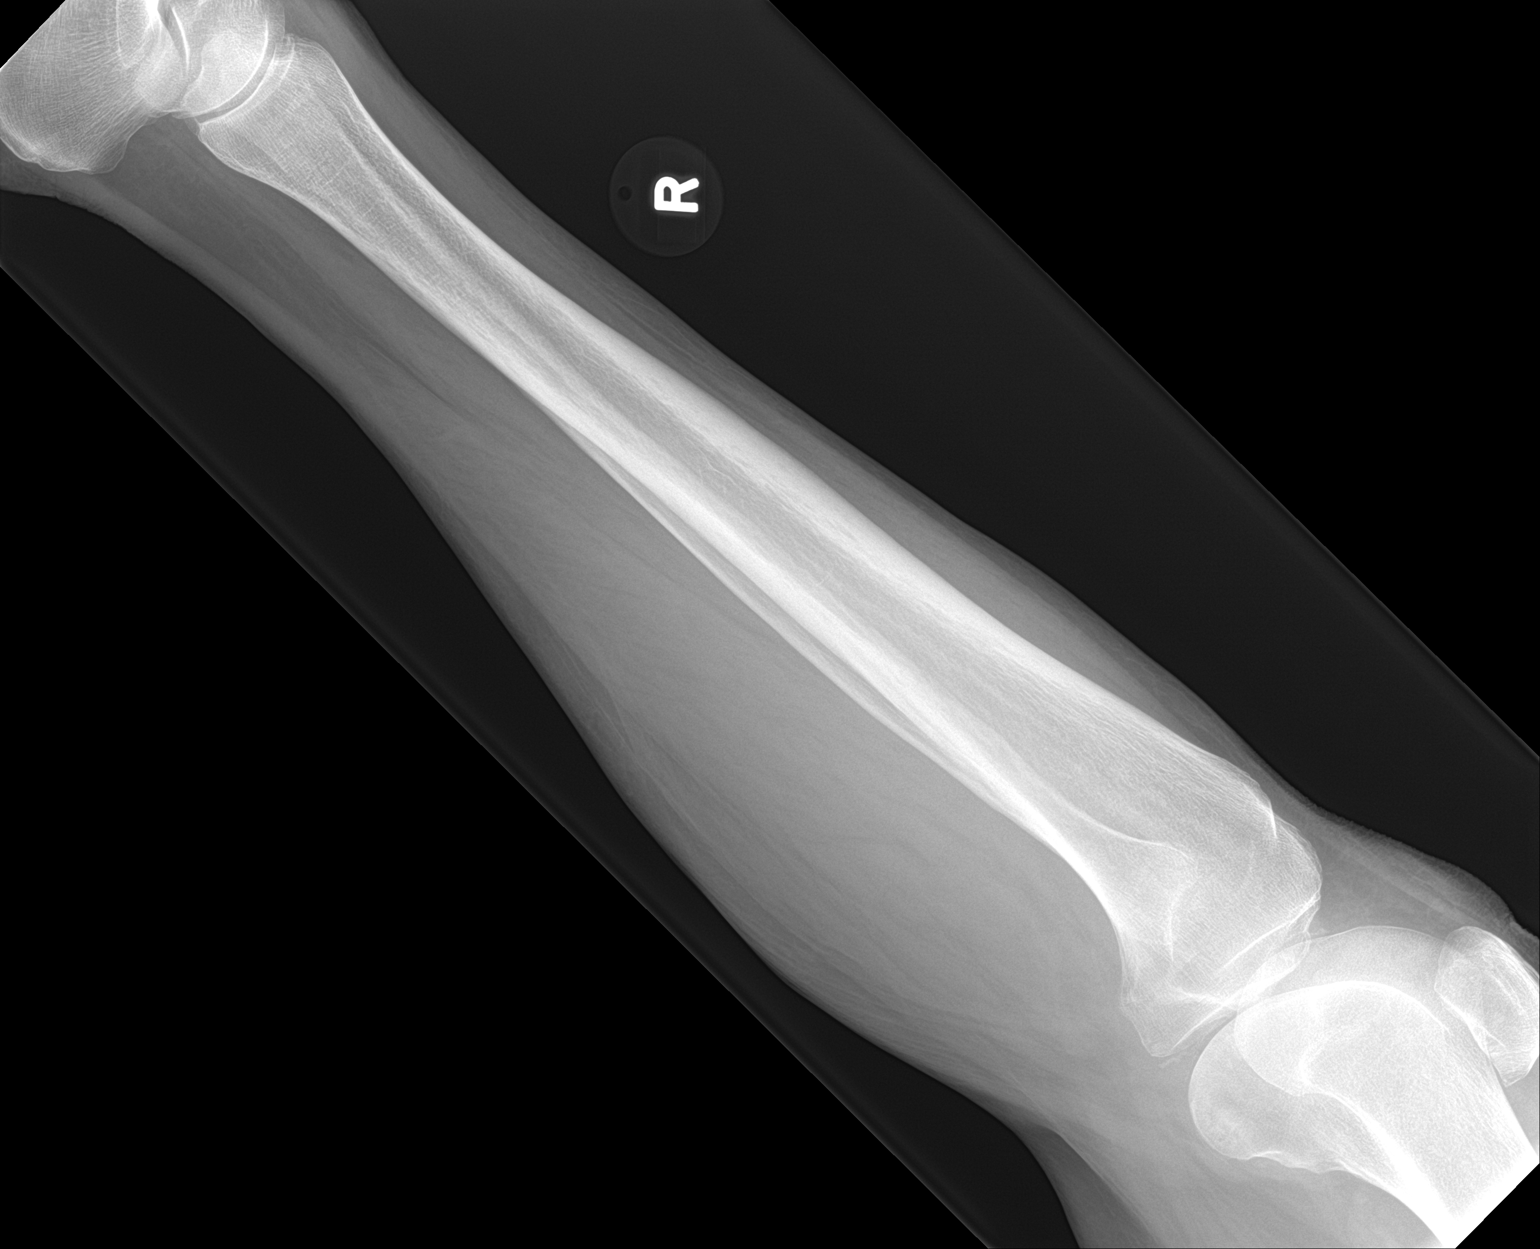

[2 of 2 positions shown; findings below may reference images not displayed]

FINDINGS: There is no evidence of fracture or other focal bone lesions. Soft
tissues are unremarkable.
IMPRESSION: Normal right tibia and fibula.

## 2018-06-08 ENCOUNTER — Other Ambulatory Visit: Payer: Self-pay

## 2018-06-09 ENCOUNTER — Ambulatory Visit (INDEPENDENT_AMBULATORY_CARE_PROVIDER_SITE_OTHER): Payer: Medicare Other | Admitting: Gastroenterology

## 2018-06-09 ENCOUNTER — Encounter: Payer: Self-pay | Admitting: Gastroenterology

## 2018-06-09 ENCOUNTER — Other Ambulatory Visit: Payer: Self-pay

## 2018-06-09 VITALS — BP 116/68 | HR 70 | Resp 17 | Wt 175.2 lb

## 2018-06-09 DIAGNOSIS — K589 Irritable bowel syndrome without diarrhea: Secondary | ICD-10-CM | POA: Insufficient documentation

## 2018-06-09 DIAGNOSIS — K219 Gastro-esophageal reflux disease without esophagitis: Secondary | ICD-10-CM | POA: Insufficient documentation

## 2018-06-09 DIAGNOSIS — K625 Hemorrhage of anus and rectum: Secondary | ICD-10-CM

## 2018-06-09 DIAGNOSIS — K640 First degree hemorrhoids: Secondary | ICD-10-CM

## 2018-06-09 DIAGNOSIS — I2510 Atherosclerotic heart disease of native coronary artery without angina pectoris: Secondary | ICD-10-CM | POA: Insufficient documentation

## 2018-06-09 DIAGNOSIS — J309 Allergic rhinitis, unspecified: Secondary | ICD-10-CM | POA: Insufficient documentation

## 2018-06-09 DIAGNOSIS — M1990 Unspecified osteoarthritis, unspecified site: Secondary | ICD-10-CM | POA: Insufficient documentation

## 2018-06-09 HISTORY — DX: Irritable bowel syndrome, unspecified: K58.9

## 2018-06-09 MED ORDER — HYDROCORTISONE 2.5 % RE CREA: 1.0000 "application " | TOPICAL_CREAM | Freq: Three times a day (TID) | RECTAL | 2 refills | Status: AC

## 2018-06-09 NOTE — Progress Notes (Signed)
[name], MD [address]  [place]  [place], [place] [address]  Main: [phone]  Fax: [phone]    Gastroenterology Consultation  Referring Provider:     Sofie Hartigan, MD Primary Care Physician:  Sofie Hartigan, MD Primary Gastroenterologist:  Dr. [name] Reason for Consultation:     Rectal bleeding        HPI:   Cynthia Conley is a 83 y.o. female referred by Dr. Ellison Hughs Chrissie Noa, MD  for consultation & management of rectal bleeding. She has history of A. Fib, on Eliquis has been experiencing intermittent episodes of blood on wiping as well as drops in the toilet with bowel movement. She also experiences rectal pressure, mucus discharge. She does have chronic constipation and sometimes associated with hard stool and straining. Bowel frequency every 2 days. She has not tried any over-the-counter medical therapy. She denies any other GI symptoms. She is accompanied by her daughter today.  NSAIDs: none  Antiplts/Anticoagulants/Anti thrombotics: eliquis for A. fib  GI Procedures: reports having had a colonoscopy few years ago and reportedly normal   Past Medical History:  Diagnosis Date  . Hypertension     Past Surgical History:  Procedure Laterality Date  . ABDOMINAL HYSTERECTOMY    . CHOLECYSTECTOMY      Current Outpatient Medications:  .  albuterol (PROAIR HFA) 108 (90 Base) MCG/ACT inhaler, INHALE 2 INHALATIONS INTO THE LUNGS EVERY 6 HOURS AS NEEDED, Disp: , Rfl:  .  apixaban (ELIQUIS) 5 MG TABS tablet, Take by mouth., Disp: , Rfl:  .  atorvastatin (LIPITOR) 20 MG tablet, TAKE 1 TABLET BY MOUTH ONCE DAILY, Disp: , Rfl:  .  CYANOCOBALAMIN IJ, Inject 1,000 mcg as directed every 30 (thirty) days., Disp: , Rfl:  .  diltiazem (CARDIZEM CD) 240 MG 24 hr capsule, Take by mouth., Disp: , Rfl:  .  esomeprazole (NEXIUM) 40 MG capsule, Take 40 mg by mouth daily at 12 noon., Disp: , Rfl:  .  fluorouracil (EFUDEX) 5 % cream, Apply 1  application topically 2 (two) times daily., Disp: , Rfl:  .  FLUoxetine (PROZAC) 10 MG capsule, Take 10 mg by mouth daily., Disp: , Rfl:  .  fluticasone (FLONASE) 50 MCG/ACT nasal spray, Place 2 sprays into both nostrils daily., Disp: , Rfl:  .  gabapentin (NEURONTIN) 300 MG capsule, Take 300 mg by mouth at bedtime., Disp: , Rfl:  .  metoprolol succinate (TOPROL-XL) 50 MG 24 hr tablet, Take by mouth., Disp: , Rfl:  .  oxybutynin (DITROPAN-XL) 5 MG 24 hr tablet, Take 5 mg by mouth at bedtime., Disp: , Rfl:  .  predniSONE (DELTASONE) 10 MG tablet, , Disp: , Rfl: 0 .  spironolactone (ALDACTONE) 25 MG tablet, TAKE 1 TABLET(25 MG) BY MOUTH EVERY DAY, Disp: , Rfl:  .  valACYclovir (VALTREX) 1000 MG tablet, Take 1,000 mg by mouth as needed., Disp: , Rfl:  .  aspirin 81 MG tablet, Take 81 mg by mouth daily., Disp: , Rfl:  .  hydrocortisone (ANUSOL-HC) 2.5 % rectal cream, Place 1 application rectally 3 (three) times daily., Disp: 30 g, Rfl: 2 .  lidocaine (LIDODERM) 5 %, Place 1 patch onto the skin daily. (Patient not taking: Reported on 06/09/2018), Disp: 15 patch, Rfl: 0 .  traMADol (ULTRAM) 50 MG tablet, Take 1 tablet (50 mg total) by mouth every 8 (eight) hours as needed. (Patient not taking: Reported on 06/09/2018), Disp: 20 tablet, Rfl: 0  No family history on file.   Social History   Tobacco Use  . Smoking status: Never Smoker  . Smokeless tobacco: Former Network engineer Use Topics  . Alcohol use: No  . Drug use: Not on file    Allergies as of 06/09/2018 - Review Complete 06/09/2018  Allergen Reaction Noted  . Codeine Hives 03/22/2014  . Hydrochlorothiazide Other (See Comments) 03/22/2014    Review of Systems:    All systems reviewed and negative except where noted in HPI.   Physical Exam:  BP 116/68 (BP Location: Left Arm, Patient Position: Sitting, Cuff Size: Large)   Pulse 70   Resp 17   Wt 175 lb 3.2 oz (79.5 kg)   LMP 04/09/1975   BMI 27.44 kg/m  Patient's last menstrual  period was 04/09/1975.  General:   Alert,  Well-developed, well-nourished, pleasant and cooperative in NAD Head:  Normocephalic and atraumatic. Eyes:  Sclera clear, no icterus.   Conjunctiva pink. Ears:  Normal auditory acuity. Nose:  No deformity, discharge, or lesions. Mouth:  No deformity or lesions,oropharynx pink & moist. Neck:  Supple; no masses or thyromegaly. Lungs:  Respirations even and unlabored.  Clear throughout to auscultation.   No wheezes, crackles, or rhonchi. No acute distress. Heart:  Regular rate and rhythm; no murmurs, clicks, rubs, or gallops. Abdomen:  Normal bowel sounds. Soft, non-tender and non-distended without masses, hepatosplenomegaly or hernias noted.  No guarding or rebound tenderness.   Rectal: rectal exam reveals right posterior external hemorrhoid, nontender, nonthrombosed Msk:  Symmetrical without gross deformities. Good, equal movement & strength bilaterally. Pulses:  Normal pulses noted. Extremities:  No clubbing or edema.  No cyanosis. Neurologic:  Alert and oriented x3;  grossly normal neurologically. Skin:  Intact without significant lesions or rashes. No jaundice. Lymph Nodes:  No significant cervical adenopathy. Psych:  Alert and cooperative. Normal mood and affect.  Imaging Studies: reviewed  Assessment and Plan:   Cynthia Conley is a 83 y.o. caucasian female with history of A. Fib on Eliquis, hypertension, chronic constipation and intermittent rectal bleeding, rectal pressure and mucus discharge. Her symptoms are highly consistent with symptomatic external and internal hemorrhoids in the setting of anticoagulation and chronic constipation, CT in 2016 revealed pancolonic diverticulosis  Discussed with patient about various medical therapies for treatment of symptomatic hemorrhoids 1. Apply tucks pads 2-3 times daily 2. Apply witch hazel cream on the skin around anus for itching or burning 3. Apply anusol cream as prescribed 2-3 times  daily, insert pea sized, 1inch inside your anal canal 4. Fiber supplements once daily 5. Follow high fiber diet 6. Also, discussed with her about outpatient hemorrhoid ligation if her symptoms persist despite medical therapy. She has to be off Eliquis for 2 days before and after the banding 7. I will also discuss with her about performing colonoscopy prior to hemorrhoid ligation   Follow up in 4 weeks   [name], MD

## 2018-06-09 NOTE — Patient Instructions (Addendum)
1. Apply tucks pads 2-3 times daily 2. Apply witch hazel cream on the skin around anus for itching or burning 3. Apply anusol cream as prescribed 2-3 times daily, insert pea sized, 1inch inside your anal canal 4. F/u in 4weeks 5. Fiber supplements once daily 6. Follow high fiber diet  Please call our office to speak with my nurse Barnie Alderman [phone] during business hours from 8am to 4pm if you have any questions/concerns. During after hours, you will be redirected to on call GI physician. For any emergency please call 911 or go the nearest emergency room.    [name], MD [address]  [place]  [place],  [address]  Main: [phone]  Fax: [phone]   High-Fiber Diet Fiber, also called dietary fiber, is a type of carbohydrate found in fruits, vegetables, whole grains, and beans. A high-fiber diet can have many health benefits. Your health care provider may recommend a high-fiber diet to help:  Prevent constipation. Fiber can make your bowel movements more regular.  Lower your cholesterol.  Relieve hemorrhoids, uncomplicated diverticulosis, or irritable bowel syndrome.  Prevent overeating as part of a weight-loss plan.  Prevent heart disease, type 2 diabetes, and certain cancers.  What is my plan? The recommended daily intake of fiber includes:  38 grams for men under age 31.  59 grams for men over age 79.  49 grams for women under age 60.  66 grams for women over age 51.  You can get the recommended daily intake of dietary fiber by eating a variety of fruits, vegetables, grains, and beans. Your health care provider may also recommend a fiber supplement if it is not possible to get enough fiber through your diet. What do I need to know about a high-fiber diet?  Fiber supplements have not been widely studied for their effectiveness, so it is better to get fiber through food sources.  Always check the fiber content on thenutrition facts label of  any prepackaged food. Look for foods that contain at least 5 grams of fiber per serving.  Ask your dietitian if you have questions about specific foods that are related to your condition, especially if those foods are not listed in the following section.  Increase your daily fiber consumption gradually. Increasing your intake of dietary fiber too quickly may cause bloating, cramping, or gas.  Drink plenty of water. Water helps you to digest fiber. What foods can I eat? Grains Whole-grain breads. Multigrain cereal. Oats and oatmeal. Brown rice. Barley. Bulgur wheat. [place]. Bran muffins. Popcorn. Rye wafer crackers. Vegetables Sweet potatoes. Spinach. Kale. Artichokes. Cabbage. Broccoli. Green peas. Carrots. Squash. Fruits Berries. Pears. Apples. Oranges. Avocados. Prunes and raisins. Dried figs. Meats and Other Protein Sources Navy, kidney, pinto, and soy beans. Split peas. Lentils. Nuts and seeds. Dairy Fiber-fortified yogurt. Beverages Fiber-fortified soy milk. Fiber-fortified orange juice. Other Fiber bars. The items listed above may not be a complete list of recommended foods or beverages. Contact your dietitian for more options. What foods are not recommended? Grains White bread. Pasta made with refined flour. White rice. Vegetables Fried potatoes. Canned vegetables. Well-cooked vegetables. Fruits Fruit juice. Cooked, strained fruit. Meats and Other Protein Sources Fatty cuts of meat. Fried Sales executive or fried fish. Dairy Milk. Yogurt. Cream cheese. Sour cream. Beverages Soft drinks. Other Cakes and pastries. Butter and oils. The items listed above may not be a complete list of foods and beverages to avoid. Contact your dietitian for more information. What are some tips for  including high-fiber foods in my diet?  Eat a wide variety of high-fiber foods.  Make sure that half of all grains consumed each day are whole grains.  Replace breads and cereals made from refined  flour or white flour with whole-grain breads and cereals.  Replace white rice with brown rice, bulgur wheat, or millet.  Start the day with a breakfast that is high in fiber, such as a cereal that contains at least 5 grams of fiber per serving.  Use beans in place of meat in soups, salads, or pasta.  Eat high-fiber snacks, such as berries, raw vegetables, nuts, or popcorn. This information is not intended to replace advice given to you by your health care provider. Make sure you discuss any questions you have with your health care provider. Document Released: 10/25/2005 Document Revised: 04/01/2016 Document Reviewed: 04/09/2014 Elsevier Interactive Patient Education  [employer].

## 2018-07-25 ENCOUNTER — Ambulatory Visit: Payer: Medicare Other | Admitting: Gastroenterology

## 2018-08-30 ENCOUNTER — Other Ambulatory Visit: Payer: Self-pay

## 2018-08-30 ENCOUNTER — Encounter: Payer: Self-pay | Admitting: Gastroenterology

## 2018-08-30 ENCOUNTER — Ambulatory Visit (INDEPENDENT_AMBULATORY_CARE_PROVIDER_SITE_OTHER): Payer: Medicare Other | Admitting: Gastroenterology

## 2018-08-30 VITALS — BP 144/73 | HR 60 | Resp 16 | Wt 176.8 lb

## 2018-08-30 DIAGNOSIS — K625 Hemorrhage of anus and rectum: Secondary | ICD-10-CM

## 2018-08-30 DIAGNOSIS — K641 Second degree hemorrhoids: Secondary | ICD-10-CM | POA: Diagnosis not present

## 2018-08-30 NOTE — Progress Notes (Signed)
[name], MD [address]  [place]  [place], [place] [address]  Main: [phone]  Fax: [phone]    Gastroenterology Consultation  Referring Provider:     Sofie Hartigan, MD Primary Care Physician:  Sofie Hartigan, MD Primary Gastroenterologist:  Dr. [name] Reason for Consultation:     Rectal bleeding        HPI:   Cynthia Conley is a 83 y.o. female referred by Dr. Ellison Hughs Chrissie Noa, MD  for consultation & management of rectal bleeding. She has history of A. Fib, on Eliquis has been experiencing intermittent episodes of blood on wiping as well as drops in the toilet with bowel movement. She also experiences rectal pressure, mucus discharge. She does have chronic constipation and sometimes associated with hard stool and straining. Bowel frequency every 2 days. She has not tried any over-the-counter medical therapy. She denies any other GI symptoms. She is accompanied by her daughter today.  Follow up visit 08/30/18: Rectal bleeding has significantly subsided. Noticing scant blood on wiping and 2-3 drops per rectum intermittently. Heavy bleeding episode didn't recur in last 82months. Currently not using anusol. Her Hb is normal in 04/2018  NSAIDs: none  Antiplts/Anticoagulants/Anti thrombotics: eliquis for A. fib  GI Procedures: reports having had a colonoscopy few years ago and reportedly normal   Past Medical History:  Diagnosis Date  . Hypertension     Past Surgical History:  Procedure Laterality Date  . ABDOMINAL HYSTERECTOMY    . CHOLECYSTECTOMY      Current Outpatient Medications:  .  albuterol (PROAIR HFA) 108 (90 Base) MCG/ACT inhaler, INHALE 2 INHALATIONS INTO THE LUNGS EVERY 6 HOURS AS NEEDED, Disp: , Rfl:  .  apixaban (ELIQUIS) 5 MG TABS tablet, Take by mouth., Disp: , Rfl:  .  atorvastatin (LIPITOR) 20 MG tablet, TAKE 1 TABLET BY MOUTH ONCE DAILY, Disp: , Rfl:  .  CYANOCOBALAMIN IJ, Inject 1,000 mcg as directed every 30  (thirty) days., Disp: , Rfl:  .  diltiazem (CARDIZEM CD) 240 MG 24 hr capsule, Take by mouth., Disp: , Rfl:  .  esomeprazole (NEXIUM) 40 MG capsule, Take 40 mg by mouth daily at 12 noon., Disp: , Rfl:  .  fluorouracil (EFUDEX) 5 % cream, Apply 1 application topically 2 (two) times daily., Disp: , Rfl:  .  FLUoxetine (PROZAC) 10 MG capsule, Take 10 mg by mouth daily., Disp: , Rfl:  .  fluticasone (FLONASE) 50 MCG/ACT nasal spray, Place 2 sprays into both nostrils daily., Disp: , Rfl:  .  gabapentin (NEURONTIN) 300 MG capsule, Take 300 mg by mouth at bedtime., Disp: , Rfl:  .  lidocaine (LIDODERM) 5 %, Place 1 patch onto the skin daily., Disp: 15 patch, Rfl: 0 .  metoprolol succinate (TOPROL-XL) 50 MG 24 hr tablet, Take by mouth., Disp: , Rfl:  .  nitroGLYCERIN (NITROSTAT) 0.4 MG SL tablet, DIS 1 T UNDER THE TONGUE Q 5 MINUTES PRF CHEST PAIN. MY TAKE UP TO 3 DOSES, Disp: , Rfl: 11 .  oxybutynin (DITROPAN-XL) 5 MG 24 hr tablet, Take 5 mg by mouth at bedtime., Disp: , Rfl:  .  predniSONE (DELTASONE) 10 MG tablet, , Disp: , Rfl: 0 .  spironolactone (ALDACTONE) 25 MG tablet, TAKE 1 TABLET(25 MG) BY MOUTH EVERY DAY, Disp: , Rfl:  .  traMADol (ULTRAM) 50 MG tablet, Take 1 tablet (50 mg total) by mouth every 8 (eight) hours as needed., Disp: 20 tablet, Rfl: 0 .  valACYclovir (VALTREX) 1000 MG tablet, Take 1,000 mg by mouth as needed., Disp: , Rfl:  .  aspirin 81 MG tablet, Take 81 mg by mouth daily., Disp: , Rfl:     No family history on file.   Social History   Tobacco Use  . Smoking status: Never Smoker  . Smokeless tobacco: Former Network engineer Use Topics  . Alcohol use: No  . Drug use: Not on file    Allergies as of 08/30/2018 - Review Complete 08/30/2018  Allergen Reaction Noted  . Codeine Hives 03/22/2014  . Hydrochlorothiazide Other (See Comments) 03/22/2014    Review of Systems:    All systems reviewed and negative except where noted in HPI.   Physical Exam:  BP (!) 144/73  (BP Location: Left Arm, Patient Position: Sitting, Cuff Size: Large)   Pulse 60   Resp 16   Wt 176 lb 12.8 oz (80.2 kg)   LMP 04/09/1975   BMI 27.69 kg/m  Patient's last menstrual period was 04/09/1975.  General:   Alert,  Well-developed, well-nourished, pleasant and cooperative in NAD Head:  Normocephalic and atraumatic. Eyes:  Sclera clear, no icterus.   Conjunctiva pink. Ears:  Normal auditory acuity. Nose:  No deformity, discharge, or lesions. Mouth:  No deformity or lesions,oropharynx pink & moist. Neck:  Supple; no masses or thyromegaly. Lungs:  Respirations even and unlabored.  Clear throughout to auscultation.   No wheezes, crackles, or rhonchi. No acute distress. Heart:  Regular rate and rhythm; no murmurs, clicks, rubs, or gallops. Abdomen:  Normal bowel sounds. Soft, non-tender and non-distended without masses, hepatosplenomegaly or hernias noted.  No guarding or rebound tenderness.   Rectal: rectal exam reveals right posterior external hemorrhoid, nontender, nonthrombosed Msk:  Symmetrical without gross deformities. Good, equal movement & strength bilaterally. Pulses:  Normal pulses noted. Extremities:  No clubbing or edema.  No cyanosis. Neurologic:  Alert and oriented x3;  grossly normal neurologically. Skin:  Intact without significant lesions or rashes. No jaundice. Lymph Nodes:  No significant cervical adenopathy. Psych:  Alert and cooperative. Normal mood and affect.  Imaging Studies: reviewed  Assessment and Plan:   Lavon Horn is a 83 y.o. caucasian female with history of A. Fib on Eliquis, hypertension, chronic constipation and intermittent rectal bleeding, rectal pressure and mucus discharge. Her symptoms are highly consistent with symptomatic external and internal hemorrhoids in the setting of anticoagulation and chronic constipation, CT in 2016 revealed pancolonic diverticulosis. Rectal bleeding has significantly reduced  Continue treatment of  symptomatic hemorrhoids as below 1. Apply tucks pads 2-3 times daily 2. Apply witch hazel cream on the skin around anus for itching or burning 3. Apply anusol cream as prescribed 2-3 times daily, insert pea sized, 1inch inside your anal canal 4. Fiber supplements as needed 5. Follow high fiber diet 6. Given that her bleeding significantly subsided, I would rather not perform banding as she is on anticoagulation unless her bleeding lleads to anemia 7. I also discussed with her about performing colonoscopy as her previous colonoscopy was several years ago and to evaluate rectal bleeding   Follow up in 4weeks   [name], MD

## 2018-08-31 ENCOUNTER — Other Ambulatory Visit: Payer: Self-pay

## 2018-08-31 LAB — CBC
HEMATOCRIT: 39.3 % (ref 34.0–46.6)
HEMOGLOBIN: 13.1 g/dL (ref 11.1–15.9)
MCH: 30.8 pg (ref 26.6–33.0)
MCHC: 33.3 g/dL (ref 31.5–35.7)
MCV: 92 fL (ref 79–97)
Platelets: 219 10*3/uL (ref 150–450)
RBC: 4.26 x10E6/uL (ref 3.77–5.28)
RDW: 12.6 % (ref 12.3–15.4)
WBC: 6.5 10*3/uL (ref 3.4–10.8)

## 2018-09-14 ENCOUNTER — Other Ambulatory Visit: Payer: Self-pay

## 2018-09-14 ENCOUNTER — Encounter
Admission: RE | Disposition: A | Discharge status: Home / Self Care | Payer: Self-pay | Source: Ambulatory Visit | Attending: Gastroenterology

## 2018-09-14 ENCOUNTER — Ambulatory Visit: Payer: Medicare Other | Admitting: Certified Registered Nurse Anesthetist

## 2018-09-14 ENCOUNTER — Ambulatory Visit
Admission: RE | Admit: 2018-09-14 | Discharge: 2018-09-14 | Disposition: A | Discharge status: Home / Self Care | Payer: Medicare Other | Source: Ambulatory Visit | Attending: Gastroenterology | Admitting: Gastroenterology

## 2018-09-14 ENCOUNTER — Encounter: Payer: Self-pay | Admitting: *Deleted

## 2018-09-14 DIAGNOSIS — D120 Benign neoplasm of cecum: Secondary | ICD-10-CM

## 2018-09-14 DIAGNOSIS — D125 Benign neoplasm of sigmoid colon: Secondary | ICD-10-CM | POA: Insufficient documentation

## 2018-09-14 DIAGNOSIS — K625 Hemorrhage of anus and rectum: Secondary | ICD-10-CM

## 2018-09-14 DIAGNOSIS — K644 Residual hemorrhoidal skin tags: Secondary | ICD-10-CM | POA: Diagnosis not present

## 2018-09-14 DIAGNOSIS — E785 Hyperlipidemia, unspecified: Secondary | ICD-10-CM | POA: Insufficient documentation

## 2018-09-14 DIAGNOSIS — D123 Benign neoplasm of transverse colon: Secondary | ICD-10-CM | POA: Insufficient documentation

## 2018-09-14 DIAGNOSIS — K5730 Diverticulosis of large intestine without perforation or abscess without bleeding: Secondary | ICD-10-CM | POA: Insufficient documentation

## 2018-09-14 DIAGNOSIS — I739 Peripheral vascular disease, unspecified: Secondary | ICD-10-CM | POA: Insufficient documentation

## 2018-09-14 DIAGNOSIS — Z79899 Other long term (current) drug therapy: Secondary | ICD-10-CM | POA: Insufficient documentation

## 2018-09-14 DIAGNOSIS — Z7982 Long term (current) use of aspirin: Secondary | ICD-10-CM | POA: Diagnosis not present

## 2018-09-14 DIAGNOSIS — D124 Benign neoplasm of descending colon: Secondary | ICD-10-CM | POA: Diagnosis not present

## 2018-09-14 DIAGNOSIS — Z7902 Long term (current) use of antithrombotics/antiplatelets: Secondary | ICD-10-CM | POA: Diagnosis not present

## 2018-09-14 DIAGNOSIS — I10 Essential (primary) hypertension: Secondary | ICD-10-CM | POA: Insufficient documentation

## 2018-09-14 DIAGNOSIS — K648 Other hemorrhoids: Secondary | ICD-10-CM | POA: Insufficient documentation

## 2018-09-14 DIAGNOSIS — K219 Gastro-esophageal reflux disease without esophagitis: Secondary | ICD-10-CM | POA: Insufficient documentation

## 2018-09-14 HISTORY — DX: Gastro-esophageal reflux disease without esophagitis: K21.9

## 2018-09-14 HISTORY — DX: Hyperlipidemia, unspecified: E78.5

## 2018-09-14 HISTORY — DX: Cardiac arrhythmia, unspecified: I49.9

## 2018-09-14 HISTORY — DX: Peripheral vascular disease, unspecified: I73.9

## 2018-09-14 HISTORY — PX: COLONOSCOPY WITH PROPOFOL: SHX5780

## 2018-09-14 SURGERY — COLONOSCOPY WITH PROPOFOL
Anesthesia: General

## 2018-09-14 MED ORDER — SODIUM CHLORIDE 0.9 % IV SOLN
INTRAVENOUS | Status: DC
  Administered 2018-09-14: 09:00:00 via INTRAVENOUS

## 2018-09-14 MED ORDER — EPHEDRINE SULFATE 50 MG/ML IJ SOLN
INTRAMUSCULAR | Status: DC | PRN
  Administered 2018-09-14 (×2): 5 mg via INTRAVENOUS

## 2018-09-14 MED ORDER — PROPOFOL 500 MG/50ML IV EMUL
INTRAVENOUS | Status: DC | PRN
  Administered 2018-09-14: 150 ug/kg/min via INTRAVENOUS

## 2018-09-14 MED ORDER — LIDOCAINE HCL (CARDIAC) PF 100 MG/5ML IV SOSY
PREFILLED_SYRINGE | INTRAVENOUS | Status: DC | PRN
  Administered 2018-09-14: 50 mg via INTRAVENOUS

## 2018-09-14 MED ORDER — PROPOFOL 500 MG/50ML IV EMUL
INTRAVENOUS | Status: AC
  Filled 2018-09-14: qty 50

## 2018-09-14 MED ORDER — PROPOFOL 10 MG/ML IV BOLUS
INTRAVENOUS | Status: DC | PRN
  Administered 2018-09-14 (×2): 40 mg via INTRAVENOUS
  Administered 2018-09-14: 20 mg via INTRAVENOUS

## 2018-09-14 NOTE — Anesthesia Preprocedure Evaluation (Addendum)
Anesthesia Evaluation  Patient identified by MRN, date of birth, ID band Patient awake    Reviewed: Allergy & Precautions, H&P , NPO status , Patient's Chart, lab work & pertinent test results  Airway Mallampati: III       Dental   Pulmonary neg pulmonary ROS,           Cardiovascular hypertension, + CAD and + Peripheral Vascular Disease  negative cardio ROS  + dysrhythmias Atrial Fibrillation   Cardiology note: "Apparently she had nonobstructive disease on catheterization. She is on statin medication. Aspirin being held, as above. She is having symptoms of exertional dyspnea and some mild chest pressure. She had a negative stress test 1 year ago. I am unsure if these symptoms represent coronary disease or are a result of deconditioning. She reports having relatively poor balance, so deconditioning is entirely likely. Still, I gave her a prescription for sublingual nitroglycerin to try on a couple of occasions when she is having symptoms to see if it improves her dyspnea or chest discomfort. If it does then we need to consider a repeat cardiac cath or long acting nitrates." Pt is not the best historian.  She is not sure whether nitrates are helping her chest pressure or not   Neuro/Psych negative neurological ROS  negative psych ROS   GI/Hepatic negative GI ROS, Neg liver ROS, GERD  ,  Endo/Other  negative endocrine ROS  Renal/GU negative Renal ROS  negative genitourinary   Musculoskeletal  (+) Arthritis ,   Abdominal   Peds  Hematology negative hematology ROS (+)   Anesthesia Other Findings Past Medical History: No date: Dysrhythmia No date: GERD (gastroesophageal reflux disease) No date: Hyperlipemia No date: Hypertension No date: Peripheral vascular disease ([place])  Past Surgical History: No date: ABDOMINAL HYSTERECTOMY No date: CHOLECYSTECTOMY No date: COLONOSCOPY  BMI    Body Mass Index:  27.66 kg/m      Reproductive/Obstetrics negative OB ROS                           Anesthesia Physical Anesthesia Plan  ASA: III  Anesthesia Plan: General   Post-op Pain Management:    Induction:   PONV Risk Score and Plan: Propofol infusion and TIVA  Airway Management Planned: Natural Airway and Nasal Cannula  Additional Equipment:   Intra-op Plan:   Post-operative Plan:   Informed Consent: I have reviewed the patients History and Physical, chart, labs and discussed the procedure including the risks, benefits and alternatives for the proposed anesthesia with the patient or authorized representative who has indicated his/her understanding and acceptance.   Dental Advisory Given  Plan Discussed with: Anesthesiologist, CRNA and Surgeon  Anesthesia Plan Comments: (Discussed that she is at increased risk for cardiac complications.)      Anesthesia Quick Evaluation

## 2018-09-14 NOTE — Anesthesia Postprocedure Evaluation (Signed)
Anesthesia Post Note  Patient: Cynthia Conley  Procedure(s) Performed: COLONOSCOPY WITH PROPOFOL (N/A )  Patient location during evaluation: PACU Anesthesia Type: General Level of consciousness: awake and alert Pain management: pain level controlled Vital Signs Assessment: post-procedure vital signs reviewed and stable Respiratory status: spontaneous breathing, nonlabored ventilation and respiratory function stable Cardiovascular status: blood pressure returned to baseline and stable Postop Assessment: no apparent nausea or vomiting Anesthetic complications: no     Last Vitals:  Vitals:   09/14/18 1053 09/14/18 1103  BP: 127/61 124/66  Pulse: 64 60  Resp: 19 20  Temp:    SpO2: 95% 94%    Last Pain:  Vitals:   09/14/18 1043  TempSrc:   PainSc: 0-No pain                 Durenda Hurt

## 2018-09-14 NOTE — Op Note (Signed)
[facility] Gastroenterology Patient Name: Cynthia Conley Procedure Date: 09/14/2018 9:48 AM MRN: [id number] Account #: [id number] Date of Birth: January 23, 1935 Admit Type: Outpatient Age: 83 Room: [facility] ENDO ROOM 3 Gender: Female Note Status: Finalized Procedure:            Colonoscopy Indications:          Rectal bleeding Providers:            Lin Landsman MD, MD Referring MD:         Sofie Hartigan (Referring MD) Medicines:            Monitored Anesthesia Care Complications:        No immediate complications. Estimated blood loss: None. Procedure:            Pre-Anesthesia Assessment:                       - Prior to the procedure, a History and Physical was                        performed, and patient medications and allergies were                        reviewed. The patient is competent. The risks and                        benefits of the procedure and the sedation options and                        risks were discussed with the patient. All questions                        were answered and informed consent was obtained.                        Patient identification and proposed procedure were                        verified by the physician, the nurse, the                        anesthesiologist, the anesthetist and the technician in                        the pre-procedure area in the procedure room in the                        endoscopy suite. Mental Status Examination: alert and                        oriented. Airway Examination: normal oropharyngeal                        airway and neck mobility. Respiratory Examination:                        clear to auscultation. CV Examination: normal.                        Prophylactic Antibiotics: The patient does not require  prophylactic antibiotics. Prior Anticoagulants: The                        patient has taken Eliquis (apixaban), last dose was 3                        days  prior to procedure. ASA Grade Assessment: III - A                        patient with severe systemic disease. After reviewing                        the risks and benefits, the patient was deemed in                        satisfactory condition to undergo the procedure. The                        anesthesia plan was to use monitored anesthesia care                        (MAC). Immediately prior to administration of                        medications, the patient was re-assessed for adequacy                        to receive sedatives. The heart rate, respiratory rate,                        oxygen saturations, blood pressure, adequacy of                        pulmonary ventilation, and response to care were                        monitored throughout the procedure. The physical status                        of the patient was re-assessed after the procedure.                       After obtaining informed consent, the colonoscope was                        passed under direct vision. Throughout the procedure,                        the patient's blood pressure, pulse, and oxygen                        saturations were monitored continuously. The                        Colonoscope was introduced through the anus and                        advanced to the the cecum, identified by appendiceal  orifice and ileocecal valve. The colonoscopy was                        performed with moderate difficulty due to multiple                        diverticula in the colon, significant looping and the                        patient's body habitus. Successful completion of the                        procedure was aided by increasing the dose of sedation                        medication, changing the patient to a prone position                        and applying abdominal pressure. The patient tolerated                        the procedure well. The quality of the bowel                         preparation was evaluated using the BBPS [facility] Bowel                        Preparation Scale) with scores of: Right Colon = 3,                        Transverse Colon = 3 and Left Colon = 3 (entire mucosa                        seen well with no residual staining, small fragments of                        stool or opaque liquid). The total BBPS score equals 9. Findings:      Hemorrhoids were found on perianal exam.      Two sessile polyps were found in the cecum. The polyps were 3 to 5 mm in       size. These polyps were removed with a cold snare. Resection and       retrieval were complete.      Four sessile polyps were found in the transverse colon. The polyps were       5 to 6 mm in size. These polyps were removed with a hot snare. Resection       and retrieval were complete.      Two sessile polyps were found in the descending colon. The polyps were 5       to 6 mm in size. These polyps were removed with a hot snare. Resection       and retrieval were complete.      A 5 mm polyp was found in the sigmoid colon. The polyp was sessile. The       polyp was removed with a cold snare. Resection and retrieval were       complete.      Multiple small and large-mouthed diverticula were found in the  recto-sigmoid colon and sigmoid colon. There was narrowing of the colon       in association with the diverticular opening. There was no evidence of       diverticular bleeding.      Non-bleeding external and internal hemorrhoids were found during       retroflexion and during perianal exam. The hemorrhoids were large. Impression:           - Hemorrhoids found on perianal exam.                       - Two 3 to 5 mm polyps in the cecum, removed with a                        cold snare. Resected and retrieved.                       - Four 5 to 6 mm polyps in the transverse colon,                        removed with a hot snare. Resected and retrieved.                       - Two  5 to 6 mm polyps in the descending colon, removed                        with a hot snare. Resected and retrieved.                       - One 5 mm polyp in the sigmoid colon, removed with a                        cold snare. Resected and retrieved.                       - Severe diverticulosis in the recto-sigmoid colon and                        in the sigmoid colon. There was narrowing of the colon                        in association with the diverticular opening. There was                        no evidence of diverticular bleeding.                       - Non-bleeding external and internal hemorrhoids,                        likely source of bleeding Recommendation:       - Discharge patient to home (with escort).                       - Cardiac diet.                       - Continue present medications.                       -  Await pathology results.                       - Resume Eliquis (apixaban) at prior dose tomorrow.                       - Repeat colonoscopy in 3 years for surveillance of                        multiple polyps.                       - Return to my office as previously scheduled. Procedure Code(s):    --- Professional ---                       [phone], Colonoscopy, flexible; with removal of tumor(s),                        polyp(s), or other lesion(s) by snare technique Diagnosis Code(s):    --- Professional ---                       K64.8, Other hemorrhoids                       D12.0, Benign neoplasm of cecum                       D12.3, Benign neoplasm of transverse colon (hepatic                        flexure or splenic flexure)                       D12.4, Benign neoplasm of descending colon                       D12.5, Benign neoplasm of sigmoid colon                       K62.5, Hemorrhage of anus and rectum                       K57.30, Diverticulosis of large intestine without                        perforation or abscess without bleeding CPT  copyright 2018 American Medical Association. All rights reserved. The codes documented in this report are preliminary and upon coder review may  be revised to meet current compliance requirements. Dr. Ulyess Mort Lin Landsman MD, MD 09/14/2018 10:33:35 AM This report has been signed electronically. Number of Addenda: 0 Note Initiated On: 09/14/2018 9:48 AM Scope Withdrawal Time: 0 hours 26 minutes 9 seconds  Total Procedure Duration: 0 hours 30 minutes 59 seconds       [facility]

## 2018-09-14 NOTE — H&P (Signed)
[name], MD [address]  [place]  [place], [place] [address]  Main: [phone]  Fax: [phone] Pager: [phone]  Primary Care Physician:  Sofie Hartigan, MD Primary Gastroenterologist:  Dr. [name]  Pre-Procedure History & Physical: HPI:  Cynthia Conley is a 83 y.o. female is here for an colonoscopy.   Past Medical History:  Diagnosis Date  . Dysrhythmia   . GERD (gastroesophageal reflux disease)   . Hyperlipemia   . Hypertension   . Peripheral vascular disease [facility])     Past Surgical History:  Procedure Laterality Date  . ABDOMINAL HYSTERECTOMY    . CHOLECYSTECTOMY    . COLONOSCOPY      Prior to Admission medications   Medication Sig Start Date End Date Taking? Authorizing Provider  albuterol (PROAIR HFA) 108 (90 Base) MCG/ACT inhaler INHALE 2 INHALATIONS INTO THE LUNGS EVERY 6 HOURS AS NEEDED 05/16/17   [provider]  apixaban (ELIQUIS) 5 MG TABS tablet Take by mouth. 01/02/18   [provider]  aspirin 81 MG tablet Take 81 mg by mouth daily.    [provider]  atorvastatin (LIPITOR) 20 MG tablet TAKE 1 TABLET BY MOUTH ONCE DAILY 08/03/17   [provider]  CYANOCOBALAMIN IJ Inject 1,000 mcg as directed every 30 (thirty) days.    [provider]  diltiazem (CARDIZEM CD) 240 MG 24 hr capsule Take by mouth. 09/28/17   [provider]  esomeprazole (NEXIUM) 40 MG capsule Take 40 mg by mouth daily at 12 noon.    [provider]  fluorouracil (EFUDEX) 5 % cream Apply 1 application topically 2 (two) times daily.    [provider]  FLUoxetine (PROZAC) 10 MG capsule Take 10 mg by mouth daily.    [provider]  fluticasone (FLONASE) 50 MCG/ACT nasal spray Place 2 sprays into both nostrils daily.    [provider]  gabapentin (NEURONTIN) 300 MG capsule Take 300 mg by mouth at bedtime.    [provider]  lidocaine (LIDODERM) 5 % Place 1 patch  onto the skin daily. 12/03/16   Daymon Larsen, MD  metoprolol succinate (TOPROL-XL) 50 MG 24 hr tablet Take by mouth. 02/21/18   [provider]  nitroGLYCERIN (NITROSTAT) 0.4 MG SL tablet DIS 1 T UNDER THE TONGUE Q 5 MINUTES PRF CHEST PAIN. MY TAKE UP TO 3 DOSES 08/21/18   [provider]  oxybutynin (DITROPAN-XL) 5 MG 24 hr tablet Take 5 mg by mouth at bedtime.    [provider]  predniSONE (DELTASONE) 10 MG tablet  05/24/18   [provider]  spironolactone (ALDACTONE) 25 MG tablet TAKE 1 TABLET(25 MG) BY MOUTH EVERY DAY 05/22/15   [provider]  traMADol (ULTRAM) 50 MG tablet Take 1 tablet (50 mg total) by mouth every 8 (eight) hours as needed. 11/28/16   Coral Spikes, DO  valACYclovir (VALTREX) 1000 MG tablet Take 1,000 mg by mouth as needed.    [provider]    Allergies as of 08/31/2018 - Review Complete 08/30/2018  Allergen Reaction Noted  . Codeine Hives 03/22/2014  . Hydrochlorothiazide Other (See Comments) 03/22/2014    History reviewed. No pertinent family history.  Social History   Socioeconomic History  . Marital status: Widowed    Spouse name: Not on file  . Number of children: Not on file  . Years of education: Not on file  . Highest education level: Not on file  Occupational  History  . Not on file  Social Needs  . Financial resource strain: Not on file  . Food insecurity:    Worry: Not on file    Inability: Not on file  . Transportation needs:    Medical: Not on file    Non-medical: Not on file  Tobacco Use  . Smoking status: Never Smoker  . Smokeless tobacco: Never Used  Substance and Sexual Activity  . Alcohol use: No  . Drug use: Never  . Sexual activity: Not on file  Lifestyle  . Physical activity:    Days per week: Not on file    Minutes per session: Not on file  . Stress: Not on file  Relationships  . Social connections:    Talks on phone: Not on file    Gets together: Not on file     Attends religious service: Not on file    Active member of club or organization: Not on file    Attends meetings of clubs or organizations: Not on file    Relationship status: Not on file  . Intimate partner violence:    Fear of current or ex partner: Not on file    Emotionally abused: Not on file    Physically abused: Not on file    Forced sexual activity: Not on file  Other Topics Concern  . Not on file  Social History Narrative  . Not on file    Review of Systems: See HPI, otherwise negative ROS  Physical Exam: BP (!) 154/81   Pulse 64   Temp 98.4 F (36.9 C) (Tympanic)   Resp 16   Ht 5\' 7"  (1.702 m)   Wt 80.1 kg   LMP 04/09/1975   SpO2 95%   BMI 27.66 kg/m  General:   Alert,  pleasant and cooperative in NAD Head:  Normocephalic and atraumatic. Neck:  Supple; no masses or thyromegaly. Lungs:  Clear throughout to auscultation.    Heart:  Regular rate and rhythm. Abdomen:  Soft, nontender and nondistended. Normal bowel sounds, without guarding, and without rebound.   Neurologic:  Alert and  oriented x4;  grossly normal neurologically.  Impression/Plan: Cynthia Conley is here for an colonoscopy to be performed for rectal bleeding  Risks, benefits, limitations, and alternatives regarding  colonoscopy have been reviewed with the patient.  Questions have been answered.  All parties agreeable.   Sherri Sear, MD  09/14/2018, 9:39 AM

## 2018-09-14 NOTE — Anesthesia Procedure Notes (Signed)
Date/Time: 09/14/2018 9:50 AM Performed by: Johnna Acosta, CRNA Pre-anesthesia Checklist: Patient identified, Emergency Drugs available, Suction available, Patient being monitored and Timeout performed Patient Re-evaluated:Patient Re-evaluated prior to induction Oxygen Delivery Method: Nasal cannula Preoxygenation: Pre-oxygenation with 100% oxygen Induction Type: IV induction

## 2018-09-14 NOTE — Transfer of Care (Signed)
Immediate Anesthesia Transfer of Care Note  Patient: Cynthia Conley  Procedure(s) Performed: COLONOSCOPY WITH PROPOFOL (N/A )  Patient Location: PACU  Anesthesia Type:General  Level of Consciousness: awake and alert   Airway & Oxygen Therapy: Patient Spontanous Breathing and Patient connected to nasal cannula oxygen  Post-op Assessment: Report given to RN and Post -op Vital signs reviewed and stable  Post vital signs: Reviewed and stable  Last Vitals:  Vitals Value Taken Time  BP 129/63 09/14/2018 10:42 AM  Temp 36.7 C 09/14/2018 10:33 AM  Pulse 57 09/14/2018 10:42 AM  Resp 19 09/14/2018 10:42 AM  SpO2 94 % 09/14/2018 10:42 AM  Vitals shown include unvalidated device data.  Last Pain:  Vitals:   09/14/18 1033  TempSrc: Tympanic  PainSc: Asleep         Complications: No apparent anesthesia complications

## 2018-09-14 NOTE — Anesthesia Post-op Follow-up Note (Signed)
Anesthesia QCDR form completed.        

## 2018-09-18 ENCOUNTER — Encounter: Payer: Self-pay | Admitting: Gastroenterology

## 2018-09-18 LAB — SURGICAL PATHOLOGY

## 2018-09-28 ENCOUNTER — Ambulatory Visit (INDEPENDENT_AMBULATORY_CARE_PROVIDER_SITE_OTHER): Payer: Medicare Other | Admitting: Gastroenterology

## 2018-09-28 ENCOUNTER — Encounter: Payer: Self-pay | Admitting: Gastroenterology

## 2018-09-28 VITALS — BP 122/61 | HR 60 | Resp 16 | Wt 174.6 lb

## 2018-09-28 DIAGNOSIS — Z86010 Personal history of colonic polyps: Secondary | ICD-10-CM

## 2018-09-28 DIAGNOSIS — K640 First degree hemorrhoids: Secondary | ICD-10-CM

## 2018-09-28 NOTE — Progress Notes (Signed)
Cynthia Darby, MD [address]  [place]  [place], [place] [address]  Main: [phone]  Fax: [phone]    Gastroenterology Consultation  Referring Provider:     Sofie Hartigan, MD Primary Care Physician:  Sofie Hartigan, MD Primary Gastroenterologist:  Dr. Cephas Conley Reason for Consultation:     Rectal bleeding        HPI:   Penni Penado is a 83 y.o. female referred by Dr. Ellison Hughs Chrissie Noa, MD  for consultation & management of rectal bleeding. She has history of A. Fib, on Eliquis has been experiencing intermittent episodes of blood on wiping as well as drops in the toilet with bowel movement. She also experiences rectal pressure, mucus discharge. She does have chronic constipation and sometimes associated with hard stool and straining. Bowel frequency every 2 days. She has not tried any over-the-counter medical therapy. She denies any other GI symptoms. She is accompanied by her daughter today.  Follow up visit 08/30/18: Rectal bleeding has significantly subsided. Noticing scant blood on wiping and 2-3 drops per rectum intermittently. Heavy bleeding episode didn't recur in last 34months. Currently not using anusol. Her Hb is normal in 04/2018  Follow-up visit 08/28/2018 Patient underwent colonoscopy which revealed several subcentimeter polyps, that were resected, tubular adenomas based on pathology.  She does have hemorrhoids.  Her constipation has currently resolved after adding more fiber in her diet and stool softener.  She tried medical management for symptomatic hemorrhoids and her symptoms have significantly improved.  She does have intermittent burning otherwise denies rectal bleeding or discomfort.  She has tried Anusol cream for 2 weeks which helped.  She denies any other symptoms today.  NSAIDs: none  Antiplts/Anticoagulants/Anti thrombotics: eliquis for A. fib  GI Procedures: reports having had a colonoscopy few years ago and reportedly  normal Colonoscopy 09/14/18 - Hemorrhoids found on perianal exam. - Two 3 to 5 mm polyps in the cecum, removed with a cold snare. Resected and retrieved. - Four 5 to 6 mm polyps in the transverse colon, removed with a hot snare. Resected and retrieved. - Two 5 to 6 mm polyps in the descending colon, removed with a hot snare. Resected and retrieved. - One 5 mm polyp in the sigmoid colon, removed with a cold snare. Resected and retrieved. - Severe diverticulosis in the recto-sigmoid colon and in the sigmoid colon. There was narrowing of the colon in association with the diverticular opening. There was no evidence of diverticular bleeding. - Non-bleeding external and internal hemorrhoids, likely source of bleeding  DIAGNOSIS:  A. COLON POLYP X2, CECUM; COLD SNARE:  - COLONIC MUCOSA WITH LYMPHOID AGGREGATE (1).  - COLONIC MUCOSA WITH FOCAL HYPERPLASTIC EPITHELIAL CHANGE (1).  - NEGATIVE FOR DYSPLASIA AND MALIGNANCY.   B. COLON POLYP X4, TRANSVERSE; HOT SNARE:  - TUBULAR ADENOMA (4).  - NEGATIVE FOR HIGH-GRADE DYSPLASIA AND MALIGNANCY.   C. COLON POLYP X2, DESCENDING; HOT SNARE:  - TUBULAR ADENOMA (1).  - NEGATIVE FOR HIGH-GRADE DYSPLASIA AND MALIGNANCY.  - DEEPER SECTIONS EXAMINED.  - FECAL DEBRIS.   D. COLON POLYP, SIGMOID; COLD SNARE:  -TUBULAR ADENOMA.  - NEGATIVE FOR HIGH-GRADE DYSPLASIA AND MALIGNANCY.  - DEEPER SECTIONS EXAMINED.  - FECAL DEBRIS.    Past Medical History:  Diagnosis Date  . Dysrhythmia   . GERD (gastroesophageal reflux disease)   . Hyperlipemia   . Hypertension   . Peripheral vascular disease [facility])     Past Surgical History:  Procedure Laterality Date  .  ABDOMINAL HYSTERECTOMY    . CHOLECYSTECTOMY    . COLONOSCOPY    . COLONOSCOPY WITH PROPOFOL N/A 09/14/2018   Procedure: COLONOSCOPY WITH PROPOFOL;  Surgeon: Lin Landsman, MD;  Location: [facility] ENDOSCOPY;  Service: Gastroenterology;  Laterality: N/A;    Current Outpatient Medications:  .   albuterol (PROAIR HFA) 108 (90 Base) MCG/ACT inhaler, INHALE 2 INHALATIONS INTO THE LUNGS EVERY 6 HOURS AS NEEDED, Disp: , Rfl:  .  apixaban (ELIQUIS) 5 MG TABS tablet, Take by mouth., Disp: , Rfl:  .  atorvastatin (LIPITOR) 20 MG tablet, TAKE 1 TABLET BY MOUTH ONCE DAILY, Disp: , Rfl:  .  CYANOCOBALAMIN IJ, Inject 1,000 mcg as directed every 30 (thirty) days., Disp: , Rfl:  .  diltiazem (CARDIZEM CD) 240 MG 24 hr capsule, Take by mouth., Disp: , Rfl:  .  esomeprazole (NEXIUM) 40 MG capsule, Take 40 mg by mouth daily at 12 noon., Disp: , Rfl:  .  fluorouracil (EFUDEX) 5 % cream, Apply 1 application topically 2 (two) times daily., Disp: , Rfl:  .  FLUoxetine (PROZAC) 10 MG capsule, Take 10 mg by mouth daily., Disp: , Rfl:  .  fluticasone (FLONASE) 50 MCG/ACT nasal spray, Place 2 sprays into both nostrils daily., Disp: , Rfl:  .  gabapentin (NEURONTIN) 300 MG capsule, Take 300 mg by mouth at bedtime., Disp: , Rfl:  .  lidocaine (LIDODERM) 5 %, Place 1 patch onto the skin daily., Disp: 15 patch, Rfl: 0 .  metoprolol succinate (TOPROL-XL) 50 MG 24 hr tablet, Take by mouth., Disp: , Rfl:  .  nitroGLYCERIN (NITROSTAT) 0.4 MG SL tablet, DIS 1 T UNDER THE TONGUE Q 5 MINUTES PRF CHEST PAIN. MY TAKE UP TO 3 DOSES, Disp: , Rfl: 11 .  oxybutynin (DITROPAN-XL) 5 MG 24 hr tablet, Take 5 mg by mouth at bedtime., Disp: , Rfl:  .  predniSONE (DELTASONE) 10 MG tablet, , Disp: , Rfl: 0 .  spironolactone (ALDACTONE) 25 MG tablet, TAKE 1 TABLET(25 MG) BY MOUTH EVERY DAY, Disp: , Rfl:  .  traMADol (ULTRAM) 50 MG tablet, Take 1 tablet (50 mg total) by mouth every 8 (eight) hours as needed., Disp: 20 tablet, Rfl: 0 .  valACYclovir (VALTREX) 1000 MG tablet, Take 1,000 mg by mouth as needed., Disp: , Rfl:     No family history on file.   Social History   Tobacco Use  . Smoking status: Never Smoker  . Smokeless tobacco: Never Used  Substance Use Topics  . Alcohol use: No  . Drug use: Never    Allergies as of  09/28/2018 - Review Complete 09/28/2018  Allergen Reaction Noted  . Codeine Hives 03/22/2014  . Hydrochlorothiazide Other (See Comments) 03/22/2014    Review of Systems:    All systems reviewed and negative except where noted in HPI.   Physical Exam:  BP 122/61 (BP Location: Left Arm, Patient Position: Sitting, Cuff Size: Large)   Pulse 60   Resp 16   Wt 174 lb 9.6 oz (79.2 kg)   LMP 04/09/1975   BMI 27.35 kg/m  Patient's last menstrual period was 04/09/1975.  General:   Alert,  Well-developed, well-nourished, pleasant and cooperative in NAD Head:  Normocephalic and atraumatic. Eyes:  Sclera clear, no icterus.   Conjunctiva pink. Ears:  Normal auditory acuity. Nose:  No deformity, discharge, or lesions. Mouth:  No deformity or lesions,oropharynx pink & moist. Neck:  Supple; no masses or thyromegaly. Lungs:  Respirations even and unlabored.  Clear throughout  to auscultation.   No wheezes, crackles, or rhonchi. No acute distress. Heart:  Regular rate and rhythm; no murmurs, clicks, rubs, or gallops. Abdomen:  Normal bowel sounds. Soft, non-tender and non-distended without masses, hepatosplenomegaly or hernias noted.  No guarding or rebound tenderness.   Rectal: rectal exam reveals right posterior external hemorrhoid, nontender, nonthrombosed Msk:  Symmetrical without gross deformities. Good, equal movement & strength bilaterally. Pulses:  Normal pulses noted. Extremities:  No clubbing or edema.  No cyanosis. Neurologic:  Alert and oriented x3;  grossly normal neurologically. Skin:  Intact without significant lesions or rashes. No jaundice. Psych:  Alert and cooperative. Normal mood and affect.  Imaging Studies: reviewed  Assessment and Plan:   Kiandria Clum is a 83 y.o. caucasian female with history of A. Fib on Eliquis, hypertension, chronic constipation and intermittent rectal bleeding, rectal pressure and mucus discharge. Her symptoms are highly consistent with  symptomatic external and internal hemorrhoids in the setting of anticoagulation and chronic constipation, CT in 2016 revealed pancolonic diverticulosis. Rectal bleeding has significantly reduced  Continue treatment of symptomatic hemorrhoids as below 1. Apply tucks pads 2-3 times daily 2. Apply witch hazel cream on the skin around anus for itching or burning 3. Apply anusol cream as prescribed 2-3 times daily, insert pea sized, 1inch inside your anal canal during flareup 4. Fiber supplements as needed 5. Follow high fiber diet 6. Given that her bleeding significantly subsided, I would rather not perform banding as she is on anticoagulation unless her bleeding lleads to anemia   Tubular adenomas of colon Recommend surveillance colonoscopy in 3 years depending on medical condition at that time   Follow up as needed   Cynthia Darby, MD

## 2018-12-06 ENCOUNTER — Encounter: Payer: Self-pay | Admitting: Emergency Medicine

## 2018-12-06 ENCOUNTER — Other Ambulatory Visit: Payer: Self-pay

## 2018-12-06 ENCOUNTER — Ambulatory Visit
Admission: EM | Admit: 2018-12-06 | Discharge: 2018-12-06 | Disposition: A | Discharge status: Home / Self Care | Payer: Medicare Other | Attending: Family Medicine | Admitting: Family Medicine

## 2018-12-06 DIAGNOSIS — R001 Bradycardia, unspecified: Secondary | ICD-10-CM | POA: Diagnosis not present

## 2018-12-06 DIAGNOSIS — B9789 Other viral agents as the cause of diseases classified elsewhere: Secondary | ICD-10-CM

## 2018-12-06 DIAGNOSIS — J0100 Acute maxillary sinusitis, unspecified: Secondary | ICD-10-CM | POA: Insufficient documentation

## 2018-12-06 MED ORDER — DOXYCYCLINE HYCLATE 100 MG PO CAPS: 100.0000 mg | ORAL_CAPSULE | Freq: Two times a day (BID) | ORAL | 0 refills | Status: DC

## 2018-12-06 NOTE — ED Triage Notes (Signed)
Patient c/o cough, chest congestion and bodyaches and nasal congestion for the past 2 weeks.

## 2018-12-06 NOTE — ED Provider Notes (Signed)
[facility]    CSN: [id number] Arrival date & time: 12/06/18  1236  History   Chief Complaint Chief Complaint  Patient presents with  . Cough  . Nasal Congestion  . Generalized Body Aches   HPI 84 year old female presents with the above complaints.  Patient reports that she has been sick for the past 2 weeks.  She reports productive cough and sinus pressure/congestion.  No fever.  She has not taken any over-the-counter medications due to her medical problems and chronic medications.  Symptoms have failed to improve with supportive care.  Moderate in severity.  No known exacerbating factors.  No reports of shortness of breath.  No other associated symptoms.  No other complaints.  History reviewed and updated as below.  Past Medical History:  Diagnosis Date  . Dysrhythmia   . GERD (gastroesophageal reflux disease)   . Hyperlipemia   . Hypertension   . Peripheral vascular disease [facility])     Patient Active Problem List   Diagnosis Date Noted  . Rectal bleeding   . CAD (coronary artery disease) 06/09/2018  . Allergic rhinitis 06/09/2018  . DJD (degenerative joint disease) 06/09/2018  . GERD (gastroesophageal reflux disease) 06/09/2018  . IBS (irritable bowel syndrome) 06/09/2018  . Paroxysmal atrial fibrillation ([place]) 05/13/2017  . Biceps tendinitis of right upper extremity 07/05/2016  . Disorder of right rotator cuff 07/05/2016  . Lumbosacral spondylosis without myelopathy 07/05/2016  . Lumbar spinal stenosis 06/28/2016  . Frequent PVCs 02/25/2016  . Degenerative arthritis of cervical spine 12/29/2015  . Chronic pain of right knee 10/16/2015  . Chronic right shoulder pain 09/15/2015  . Benign essential hypertension 04/18/2015  . B12 deficiency 06/07/2014  . Mixed hyperlipidemia 04/18/2014    Past Surgical History:  Procedure Laterality Date  . ABDOMINAL HYSTERECTOMY    . CHOLECYSTECTOMY    . COLONOSCOPY    . COLONOSCOPY WITH PROPOFOL N/A 09/14/2018   Procedure: COLONOSCOPY WITH PROPOFOL;  Surgeon: Lin Landsman, MD;  Location: [facility] ENDOSCOPY;  Service: Gastroenterology;  Laterality: N/A;    OB History   No obstetric history on file.      Home Medications    Prior to Admission medications   Medication Sig Start Date End Date Taking? Authorizing Provider  albuterol (PROAIR HFA) 108 (90 Base) MCG/ACT inhaler INHALE 2 INHALATIONS INTO THE LUNGS EVERY 6 HOURS AS NEEDED 05/16/17  Yes [provider]  apixaban (ELIQUIS) 5 MG TABS tablet Take by mouth. 01/02/18  Yes [provider]  atorvastatin (LIPITOR) 20 MG tablet TAKE 1 TABLET BY MOUTH ONCE DAILY 08/03/17  Yes [provider]  CYANOCOBALAMIN IJ Inject 1,000 mcg as directed every 30 (thirty) days.   Yes [provider]  diltiazem (CARDIZEM CD) 240 MG 24 hr capsule Take by mouth. 09/28/17  Yes [provider]  esomeprazole (NEXIUM) 40 MG capsule Take 40 mg by mouth daily at 12 noon.   Yes [provider]  fluorouracil (EFUDEX) 5 % cream Apply 1 application topically 2 (two) times daily.   Yes [provider]  FLUoxetine (PROZAC) 10 MG capsule Take 20 mg by mouth daily.    Yes [provider]  fluticasone (FLONASE) 50 MCG/ACT nasal spray Place 2 sprays into both nostrils daily.   Yes [provider]  gabapentin (NEURONTIN) 300 MG capsule Take 300 mg by mouth at bedtime.   Yes [provider]  metoprolol succinate (TOPROL-XL) 50 MG 24 hr tablet Take by mouth. 02/21/18  Yes [provider]  oxybutynin (DITROPAN-XL) 5 MG 24 hr tablet Take 5 mg by mouth at bedtime.   Yes [provider]  spironolactone (ALDACTONE) 25 MG tablet TAKE 1 TABLET(25 MG) BY MOUTH EVERY DAY 05/22/15  Yes [provider]  valACYclovir (VALTREX) 1000 MG tablet Take 1,000 mg by mouth as needed.   Yes [provider]  doxycycline (VIBRAMYCIN) 100 MG capsule Take 1 capsule (100 mg total) by mouth 2  (two) times daily. 12/06/18   Coral Spikes, DO  lidocaine (LIDODERM) 5 % Place 1 patch onto the skin daily. 12/03/16   Daymon Larsen, MD  nitroGLYCERIN (NITROSTAT) 0.4 MG SL tablet DIS 1 T UNDER THE TONGUE Q 5 MINUTES PRF CHEST PAIN. MY TAKE UP TO 3 DOSES 08/21/18   [provider]  traMADol (ULTRAM) 50 MG tablet Take 1 tablet (50 mg total) by mouth every 8 (eight) hours as needed. 11/28/16   Coral Spikes, DO   Social History Social History   Tobacco Use  . Smoking status: Never Smoker  . Smokeless tobacco: Never Used  Substance Use Topics  . Alcohol use: No  . Drug use: Never     Allergies   Codeine and Hydrochlorothiazide   Review of Systems Review of Systems   Physical Exam Triage Vital Signs ED Triage Vitals  Enc Vitals Group     BP 12/06/18 1304 (!) 122/53     Pulse Rate 12/06/18 1304 (!) 59     Resp 12/06/18 1304 16     Temp 12/06/18 1304 98.2 F (36.8 C)     Temp Source 12/06/18 1304 Oral     SpO2 12/06/18 1304 96 %     Weight 12/06/18 1300 173 lb (78.5 kg)     Height 12/06/18 1300 5\' 4"  (1.626 m)     Head Circumference --      Peak Flow --      Pain Score 12/06/18 1259 3     Pain Loc --      Pain Edu? --      Excl. in [place]? --    Updated Vital Signs BP (!) 122/53 (BP Location: Left Arm)   Pulse (!) 59   Temp 98.2 F (36.8 C) (Oral)   Resp 16   Ht 5\' 4"  (1.626 m)   Wt 78.5 kg   LMP 04/09/1975   SpO2 96%   BMI 29.70 kg/m   Visual Acuity Right Eye Distance:   Left Eye Distance:   Bilateral Distance:    Right Eye Near:   Left Eye Near:    Bilateral Near:     Physical Exam Constitutional:      General: She is not in acute distress. HENT:     Head: Normocephalic and atraumatic.     Right Ear: Tympanic membrane normal.     Left Ear: Tympanic membrane normal.     Nose: Congestion present. No rhinorrhea.     Comments: Maxillary sinus tenderness to palpation.    Mouth/Throat:     Mouth: Mucous membranes are moist.     Pharynx:  Oropharynx is clear.  Eyes:     General:        Right eye: No discharge.        Left eye: No discharge.     Conjunctiva/sclera: Conjunctivae normal.  Cardiovascular:     Rate and Rhythm: Regular rhythm. Bradycardia present.  Pulmonary:     Effort: Pulmonary effort is normal.     Breath sounds: Normal breath sounds.  Neurological:     Mental Status: She is alert.  Psychiatric:        Mood and Affect: Mood normal.        Behavior: Behavior normal.     UC Treatments / Results  Labs (all labs ordered are listed, but only abnormal results are displayed) Labs Reviewed - No data to display  EKG EKG obtained due to bradycardia.  Interpretation: Sinus bradycardia at the rate of 58.  Normal intervals.  Normal axis.  No ST-T wave changes.  Unremarkable EKG  Radiology No results found.  Procedures Procedures (including critical care time)  Medications Ordered in UC Medications - No data to display  Initial Impression / Assessment and Plan / UC Course  I have reviewed the triage vital signs and the nursing notes.  Pertinent labs & imaging results that were available during my care of the patient were reviewed by me and considered in my medical decision making (see chart for details).    84 year old female presents with sinusitis.  Treated with doxycycline.  Supportive care.  Final Clinical Impressions(s) / UC Diagnoses   Final diagnoses:  Acute maxillary sinusitis, recurrence not specified     Discharge Instructions     Antibiotic as prescribed.  Take care  Dr. Lacinda Axon    ED Prescriptions    Medication Sig Dispense Auth. Provider   doxycycline (VIBRAMYCIN) 100 MG capsule Take 1 capsule (100 mg total) by mouth 2 (two) times daily. 14 capsule Coral Spikes, DO     Controlled Substance Prescriptions [place] Controlled Substance Registry consulted? Not Applicable   Coral Spikes, DO 12/06/18 1425

## 2018-12-06 NOTE — Discharge Instructions (Signed)
Antibiotic as prescribed.  Take care  Dr. Mico Spark  

## 2019-05-02 DIAGNOSIS — N1832 Chronic kidney disease, stage 3b: Secondary | ICD-10-CM | POA: Insufficient documentation

## 2019-05-02 DIAGNOSIS — N183 Chronic kidney disease, stage 3 unspecified: Secondary | ICD-10-CM | POA: Insufficient documentation

## 2020-03-25 ENCOUNTER — Ambulatory Visit: Payer: Medicare Other | Admitting: Gastroenterology

## 2020-04-22 ENCOUNTER — Other Ambulatory Visit: Payer: Self-pay

## 2020-04-23 ENCOUNTER — Other Ambulatory Visit: Payer: Self-pay

## 2020-04-23 ENCOUNTER — Ambulatory Visit (INDEPENDENT_AMBULATORY_CARE_PROVIDER_SITE_OTHER): Payer: Medicare HMO | Admitting: Gastroenterology

## 2020-04-23 ENCOUNTER — Encounter: Payer: Self-pay | Admitting: Gastroenterology

## 2020-04-23 VITALS — BP 132/63 | HR 61 | Temp 97.8°F | Ht 64.0 in | Wt 179.2 lb

## 2020-04-23 DIAGNOSIS — K642 Third degree hemorrhoids: Secondary | ICD-10-CM

## 2020-04-23 DIAGNOSIS — R7302 Impaired glucose tolerance (oral): Secondary | ICD-10-CM | POA: Insufficient documentation

## 2020-04-23 DIAGNOSIS — K623 Rectal prolapse: Secondary | ICD-10-CM

## 2020-04-23 MED ORDER — HYDROCORTISONE (PERIANAL) 2.5 % EX CREA: TOPICAL_CREAM | CUTANEOUS | 1 refills | Status: DC

## 2020-04-23 NOTE — Patient Instructions (Signed)

## 2020-04-23 NOTE — Progress Notes (Signed)
[name], MD [address]  [place]  [place], [place] [address]  Main: [phone]  Fax: [phone]    Gastroenterology Consultation  Referring Provider:     Sofie Hartigan, MD Primary Care Physician:  Sofie Hartigan, MD Primary Gastroenterologist:  Dr. [name] Reason for Consultation:     Rectal bleeding        HPI:   Cynthia Conley is a 85 y.o. female referred by Dr. Ellison Hughs Chrissie Noa, MD  for consultation & management of rectal bleeding. She has history of A. Fib, on Eliquis has been experiencing intermittent episodes of blood on wiping as well as drops in the toilet with bowel movement. She also experiences rectal pressure, mucus discharge. She does have chronic constipation and sometimes associated with hard stool and straining. Bowel frequency every 2 days. She has not tried any over-the-counter medical therapy. She denies any other GI symptoms. She is accompanied by her daughter today.  Follow up visit 08/30/18: Rectal bleeding has significantly subsided. Noticing scant blood on wiping and 2-3 drops per rectum intermittently. Heavy bleeding episode didn't recur in last 16months. Currently not using anusol. Her Hb is normal in 04/2018  Follow-up visit 08/28/2018 Patient underwent colonoscopy which revealed several subcentimeter polyps, that were resected, tubular adenomas based on pathology.  She does have hemorrhoids.  Her constipation has currently resolved after adding more fiber in her diet and stool softener.  She tried medical management for symptomatic hemorrhoids and her symptoms have significantly improved.  She does have intermittent burning otherwise denies rectal bleeding or discomfort.  She has tried Anusol cream for 2 weeks which helped.  She denies any other symptoms today.  Follow-up visit 04/23/2020 Patient made an appointment to discuss about worsening of what she thinks is rectal prolapse versus hemorrhoids.  She reports that for  last 6 weeks or so, she reports feeling heavy and something slipping down through the anus, every time she stands or having a bowel movement.  She notices it about 1 inch length of tissue protruding out that has an orifice in the center.  She tries to push it inside and experiences discomfort.  She reports that she was doing well with regards to her hemorrhoid symptoms, rectal bleeding until recently.  She was using Anusol cream as needed.  She does report intermittent constipation associated with straining.  She says she could do better with regards to intake of fiber and regular use of stool softeners.  She had 9 pregnancies, all vaginal deliveries.  She does report urinary incontinence for which she wears panty liner all the time  NSAIDs: none  Antiplts/Anticoagulants/Anti thrombotics: eliquis for A. fib  GI Procedures: reports having had a colonoscopy few years ago and reportedly normal Colonoscopy 09/14/18 - Hemorrhoids found on perianal exam. - Two 3 to 5 mm polyps in the cecum, removed with a cold snare. Resected and retrieved. - Four 5 to 6 mm polyps in the transverse colon, removed with a hot snare. Resected and retrieved. - Two 5 to 6 mm polyps in the descending colon, removed with a hot snare. Resected and retrieved. - One 5 mm polyp in the sigmoid colon, removed with a cold snare. Resected and retrieved. - Severe diverticulosis in the recto-sigmoid colon and in the sigmoid colon. There was narrowing of the colon in association with the diverticular opening. There was no evidence of diverticular bleeding. - Non-bleeding external and internal hemorrhoids, likely source of bleeding  DIAGNOSIS:  A.  COLON POLYP X2, CECUM; COLD SNARE:  - COLONIC MUCOSA WITH LYMPHOID AGGREGATE (1).  - COLONIC MUCOSA WITH FOCAL HYPERPLASTIC EPITHELIAL CHANGE (1).  - NEGATIVE FOR DYSPLASIA AND MALIGNANCY.   B. COLON POLYP X4, TRANSVERSE; HOT SNARE:  - TUBULAR ADENOMA (4).  - NEGATIVE FOR HIGH-GRADE  DYSPLASIA AND MALIGNANCY.   C. COLON POLYP X2, DESCENDING; HOT SNARE:  - TUBULAR ADENOMA (1).  - NEGATIVE FOR HIGH-GRADE DYSPLASIA AND MALIGNANCY.  - DEEPER SECTIONS EXAMINED.  - FECAL DEBRIS.   D. COLON POLYP, SIGMOID; COLD SNARE:  -TUBULAR ADENOMA.  - NEGATIVE FOR HIGH-GRADE DYSPLASIA AND MALIGNANCY.  - DEEPER SECTIONS EXAMINED.  - FECAL DEBRIS.    Past Medical History:  Diagnosis Date  . Dysrhythmia   . GERD (gastroesophageal reflux disease)   . Hyperlipemia   . Hypertension   . Peripheral vascular disease [facility])     Past Surgical History:  Procedure Laterality Date  . ABDOMINAL HYSTERECTOMY    . CHOLECYSTECTOMY    . COLONOSCOPY    . COLONOSCOPY WITH PROPOFOL N/A 09/14/2018   Procedure: COLONOSCOPY WITH PROPOFOL;  Surgeon: Lin Landsman, MD;  Location: [facility] ENDOSCOPY;  Service: Gastroenterology;  Laterality: N/A;    Current Outpatient Medications:  .  albuterol (PROAIR HFA) 108 (90 Base) MCG/ACT inhaler, INHALE 2 INHALATIONS INTO THE LUNGS EVERY 6 HOURS AS NEEDED, Disp: , Rfl:  .  apixaban (ELIQUIS) 5 MG TABS tablet, Take by mouth., Disp: , Rfl:  .  atorvastatin (LIPITOR) 20 MG tablet, TAKE 1 TABLET BY MOUTH ONCE DAILY, Disp: , Rfl:  .  CYANOCOBALAMIN IJ, Inject 1,000 mcg as directed every 30 (thirty) days., Disp: , Rfl:  .  diltiazem (CARDIZEM CD) 240 MG 24 hr capsule, Take by mouth., Disp: , Rfl:  .  diltiazem (TIAZAC) 360 MG 24 hr capsule, TAKE 1 CAPSULE(360 MG) BY MOUTH EVERY DAY, Disp: , Rfl:  .  doxycycline (VIBRAMYCIN) 100 MG capsule, Take 1 capsule (100 mg total) by mouth 2 (two) times daily., Disp: 14 capsule, Rfl: 0 .  esomeprazole (NEXIUM) 40 MG capsule, Take 40 mg by mouth daily at 12 noon., Disp: , Rfl:  .  fluorouracil (EFUDEX) 5 % cream, Apply 1 application topically 2 (two) times daily., Disp: , Rfl:  .  FLUoxetine (PROZAC) 10 MG capsule, Take 20 mg by mouth daily. , Disp: , Rfl:  .  fluticasone (FLONASE) 50 MCG/ACT nasal spray, Place 2 sprays into  both nostrils daily., Disp: , Rfl:  .  gabapentin (NEURONTIN) 300 MG capsule, Take 300 mg by mouth at bedtime., Disp: , Rfl:  .  hydrocortisone (ANUSOL-HC) 2.5 % rectal cream, APPLY TOPICALLY TO THE AFFECTED AREA THREE TIMES DAILY FOR 10 DAYS, Disp: , Rfl:  .  lidocaine (LIDODERM) 5 %, Place 1 patch onto the skin daily., Disp: 15 patch, Rfl: 0 .  metoprolol succinate (TOPROL-XL) 50 MG 24 hr tablet, Take by mouth., Disp: , Rfl:  .  nitroGLYCERIN (NITROSTAT) 0.4 MG SL tablet, DIS 1 T UNDER THE TONGUE Q 5 MINUTES PRF CHEST PAIN. MY TAKE UP TO 3 DOSES, Disp: , Rfl: 11 .  oxybutynin (DITROPAN-XL) 5 MG 24 hr tablet, Take 5 mg by mouth at bedtime., Disp: , Rfl:  .  spironolactone (ALDACTONE) 25 MG tablet, TAKE 1 TABLET(25 MG) BY MOUTH EVERY DAY, Disp: , Rfl:  .  traMADol (ULTRAM) 50 MG tablet, Take 1 tablet (50 mg total) by mouth every 8 (eight) hours as needed., Disp: 20 tablet, Rfl: 0 .  valACYclovir (VALTREX)  1000 MG tablet, Take 1,000 mg by mouth as needed., Disp: , Rfl:     History reviewed. No pertinent family history.   Social History   Tobacco Use  . Smoking status: Never Smoker  . Smokeless tobacco: Never Used  Vaping Use  . Vaping Use: Never used  Substance Use Topics  . Alcohol use: No  . Drug use: Never    Allergies as of 04/23/2020 - Review Complete 04/23/2020  Allergen Reaction Noted  . Codeine Hives 03/22/2014  . Hydrochlorothiazide Other (See Comments) 03/22/2014    Review of Systems:    All systems reviewed and negative except where noted in HPI.   Physical Exam:  BP 132/63 (BP Location: Left Arm, Patient Position: Sitting, Cuff Size: Normal)   Pulse 61   Temp 97.8 F (36.6 C) (Oral)   Ht 5\' 4"  (1.626 m)   Wt 179 lb 4 oz (81.3 kg)   LMP 04/09/1975   BMI 30.77 kg/m  Patient's last menstrual period was 04/09/1975.  General:   Alert,  Well-developed, well-nourished, pleasant and cooperative in NAD Head:  Normocephalic and atraumatic. Eyes:  Sclera clear, no  icterus.   Conjunctiva pink. Ears:  Normal auditory acuity.   Nose:  No deformity, discharge, or lesions. Mouth:  No deformity or lesions,oropharynx pink & moist. Neck:  Supple; no masses or thyromegaly. Lungs:  Respirations even and unlabored.  Clear throughout to auscultation.   No wheezes, crackles, or rhonchi. No acute distress. Heart:  Regular rate and rhythm; no murmurs, clicks, rubs, or gallops. Abdomen:  Normal bowel sounds. Soft, non-tender and non-distended without masses, hepatosplenomegaly or hernias noted.  No guarding or rebound tenderness.   Rectal: External rectal exam reveals poor sphincter tone, prolapsed hemorrhoids, manually reduced without any pain Msk:  Symmetrical without gross deformities. Good, equal movement & strength bilaterally. Pulses:  Normal pulses noted. Extremities:  No clubbing or edema.  No cyanosis. Neurologic:  Alert and oriented x3;  grossly normal neurologically. Skin:  Intact without significant lesions or rashes. No jaundice. Psych:  Alert and cooperative. Normal mood and affect.  Imaging Studies: reviewed  Assessment and Plan:   Iona Stay is a 85 y.o. caucasian female with history of A. Fib on Eliquis, hypertension, chronic constipation, symptomatic grade 2 hemorrhoids is here for follow-up of worsening of rectal pressure and possible rectal prolapse  Rectal pressure, tissue prolapse She definitely has grade 3 external hemorrhoids and possible incomplete rectal prolapse based on her history and exam Given her history of anticoagulation, I recommend referral to colorectal for further evaluation and possible repair Patient did not want prefer to go to [facility], therefore referred her to [facility] colorectal surgeon, Dr. Victorio Palm    Tubular adenomas of colon Recommend surveillance colonoscopy in 3 years depending on medical condition at that time   Follow up as needed   [name], MD

## 2020-06-03 ENCOUNTER — Other Ambulatory Visit: Payer: Self-pay | Admitting: Nephrology

## 2020-06-03 DIAGNOSIS — N184 Chronic kidney disease, stage 4 (severe): Secondary | ICD-10-CM

## 2020-06-10 ENCOUNTER — Ambulatory Visit
Admission: RE | Admit: 2020-06-10 | Discharge: 2020-06-10 | Disposition: A | Discharge status: Home / Self Care | Payer: Medicare HMO | Source: Ambulatory Visit | Attending: Nephrology | Admitting: Nephrology

## 2020-06-10 ENCOUNTER — Other Ambulatory Visit: Payer: Self-pay

## 2020-06-10 DIAGNOSIS — N184 Chronic kidney disease, stage 4 (severe): Secondary | ICD-10-CM | POA: Diagnosis present

## 2020-06-10 DIAGNOSIS — E1122 Type 2 diabetes mellitus with diabetic chronic kidney disease: Secondary | ICD-10-CM | POA: Insufficient documentation

## 2020-06-11 ENCOUNTER — Ambulatory Visit: Payer: Medicare HMO

## 2021-05-04 NOTE — Discharge Summary (Signed)
 "  [facility] Medicine Discharge Summary  Admit Date: 04/30/2021 Discharge Date: 05/05/2021  Admitting Physician: Rosina Earnie Hudson, MD Discharge Physician: Missy Nichole Millman, MD  Primary Care Provider: Jeffie Cheryl Therman Mickey., MD, Phone [phone]  Discharge Destination: Home  Admission Diagnoses:  Atrial fibrillation with rapid ventricular response (CMS-HCC) [I48.91] Moderate tricuspid insufficiency [I07.1] Moderate mitral insufficiency [I34.0] Hypotension, unspecified hypotension type [I95.9] Coronary artery disease involving native coronary artery of native heart without angina pectoris [I25.10] Atrial fibrillation, unspecified type (CMS-HCC) [I48.91]  Discharge Diagnoses:  Principal Problem:   Atrial fibrillation with rapid ventricular response (CMS-HCC) Active Problems:   Mixed hyperlipidemia   CAD (coronary artery disease)   Moderate tricuspid insufficiency   Moderate mitral insufficiency   Benign essential hypertension   CRI (chronic renal insufficiency), stage 3 (moderate) (CMS-HCC)   Hypotension Resolved Problems:   * No resolved hospital problems. *  Primary Diagnosis: Admitted for afib with RVR  Changes Made:   - Started flecainide  50 mg q12h - Decreased metop XL 12.5 mg daily  Anticipatory Guidance for Outpatient Provider:  -q6 month EKG to monitor QTc, QRS and PR intervals.  -follow up with patient regarding sleep apnea diagnosis and need for cpap (patient report she was unaware of diagnosis)   Recommended Follow-up Studies: none     Results Pending at Discharge:  None Please see phone numbers at end of this summary for lab contact information.   Follow-up/Care Transition Plan: Future Appointments  Date Time Provider Department Center  05/15/2021  1:00 PM [facility] US  1 [facility]  05/18/2021 11:00 AM Livingston Palma, PA [facility]  06/09/2021  2:00 PM Hester Wolm Heinz, MD [facility]  06/15/2021   1:45 PM [facility]  06/15/2021  2:00 PM Feldpausch, Cheryl Therman Mickey., MD [facility]   Non[facility] Provider Follow-up: none    Allergies/Intolerances:  Allergies  Allergen Reactions   Codeine Hives   Hydrochlorothiazide Other (See Comments)    hypokalemia     New Adverse Drug Events: none  Medications:   Current Discharge Medication List    START taking these medications   Details  flecainide  (TAMBOCOR ) 50 MG tablet Take 1 tablet (50 mg total) by mouth every 12 (twelve) hours Qty: 60 tablet, Refills: 0      CONTINUE these medications which have CHANGED   Details  metoprolol  succinate (TOPROL -XL) 25 MG XL tablet Take 0.5 tablets (12.5 mg total) by mouth once daily for 30 days Qty: 15 tablet, Refills: 0    spironolactone (ALDACTONE) 25 MG tablet Take 1 tablet (25 mg total) by mouth once daily Qty: 90 tablet, Refills: 0   Associated Diagnoses: Essential hypertension      CONTINUE these medications which have NOT CHANGED   Details  apixaban  (ELIQUIS ) 5 mg tablet Take 1 tablet (5 mg total) by mouth every 12 (twelve) hours Qty: 180 tablet, Refills: 1    atorvastatin  (LIPITOR) 20 MG tablet Take 1 tablet (20 mg total) by mouth once daily Qty: 90 tablet, Refills: 3   Associated Diagnoses: Mixed hyperlipidemia    esomeprazole (NEXIUM) 40 MG DR capsule Take 1 capsule (40 mg total) by mouth once daily Qty: 90 capsule, Refills: 3   Associated Diagnoses: Gastroesophageal reflux disease without esophagitis    gabapentin  (NEURONTIN ) 300 MG capsule Take 1 capsule (300 mg total) by mouth once daily Qty: 90 capsule, Refills: 3    nitroGLYcerin  (NITROSTAT ) 0.4 MG SL tablet  Place 1 tablet (0.4 mg total) under the tongue every 5 (five) minutes as needed for Chest pain May take up to 3 doses. Qty: 25 tablet, Refills: 3    oxybutynin  (DITROPAN -XL) 5 MG XL tablet Take 1 tablet (5 mg total) by mouth once daily Qty: 90 tablet, Refills: 3     albuterol 90 mcg/actuation inhaler INHALE 2 INHALATIONS INTO THE LUNGS EVERY 4 HOURS AS NEEDED FOR WHEEZING Qty: 6.7 g, Refills: 3   Associated Diagnoses: Seasonal allergies    cyanocobalamin (VITAMIN B12) 1,000 mcg/mL injection Inject into the muscle monthly.    fluticasone propionate (FLONASE) 50 mcg/actuation nasal spray Place 2 sprays into both nostrils once daily Qty: 16 g, Refills: 3   Associated Diagnoses: Seasonal allergies    predniSONE (DELTASONE) 10 MG tablet 6 day taper - Take as directed; take as needed pain flareup Qty: 21 tablet, Refills: 0    valACYclovir (VALTREX) 1000 MG tablet Take 1,000 mg by mouth as needed.   Refills: 0         Brief History of Present Illness:  Per admission H&P : Cynthia Conley is a 86 y.o. female with a past medical history significant for atrial fibrillation, CAD, GERD, hyperlipidemia, hypertension, IBS, mitral valve disorder, RA, sleep apnea on CPAP, SVT.  Patient presents to the ED with complaints of chest discomfort and pressure as well as burning in her throat with palpitations -she took a nitro at home around 7:15 PM.  Called EMS on arrival heart rate was ranging from A. fib RVR with heart rate ranging 130-200.  BP low 93/58 heart rate 107 she was given a bolus of IV Dilts and started on adult infusion.  She also received 400 cc of fluid.  Has a history of paroxysmal atrial fibrillation follows closely with cardiology.  Appears she develops episodes approximately every 2 months.  She is on Eliquis  5 mg anticoagulation for stroke prevention.  On metoprolol  but states she was taken off her diltiazem  a few weeks ago but does not know why - per cardiology notes it appears it was stopped for side effects of fatigue. She has not noticed a difference in her fatigue level since stopping. Currently denies any CP, SOB, cough, fever/chills, N/V, diaphoresis, abdominal pain, dysuria, black or bloody stools, or LE edema. She lives at home alone and  gets around independently.   She had a right and left heart cath January 2021 showing mild nonobstructive coronary artery disease normal LVEDP normal right heart pressures with mildly reduced cardiac index. Last ECHO 09/2019 showed mildly asymmetric hypertrophy of the interventricular septum with no evidence of outflow obstruction, akinesis of the basal and mid inferior lateral wall. EF preserved. Moderate mitral regurg and mild tricuspid regurg.   ED work-up included vitals showing heart rate controlled occasionally bumping to the 1 teens but pressures soft and 80s to 90s systolic.  EKG confirmed atrial fibrillation with heart rate of 77.  Cardiac enzymes negative.  BNP 119 chest x-ray with bibasilar segmental atelectasis suspect bilateral small pleural effusions.  CBC unremarkable.  CMP unremarkable other than creatinine of 1.7 which appears baseline.  INR 1.6.  Magnesium , TSH, and rapid COVID still to be collected.  In the ED she got an additional bolus of diltiazem  and started on dill drip.  She received her evening dose of Eliquis .  Also received an additional 1 L bolus.  Heart rate remains controlled on 2.5 mg of dill drip.  Given A. fib RVR patient will be  admitted to the hospital.  _____________________  Hospital Course by Problem:  Atrial fibrillation with RVR (RVR resolved)  Moderate tricupside insufficiency, moderate mitral insufficiency Nonobstructive CAD Tachybradycardia syndrome Presented with afib RVR. Hx of paroxysmal a.fib. Takes eliquis  and metop. Recently stopped her cardizem  due to adverse effect with fatigue. Followed by outpatient cardiology. Reports episodes of afib every ~2 months. Ruled out ACS with EKG and serial troponin. BNP 119, CXR with bibasilar atelectasis and possible small pleural effusions. Echo with EF >55% and no WMA, evidence of moderate mitral regurg and mild tricuspid regurg. No evidence of infectious process. LHC 11/2019 with mild, nonobstructive CAD. Was  started on dilt gtt with rapid conversion to NSR, on fractionated dilt and home metop though did experience symptomatic bradycardia; metop was stopped and patient went back in to RVR. Dilt gtt re-initatied with again, rapid conversion. Appreciate cardiology given difficulty in controlling HR; started flecainide  and decreased metop XL to 12.5 mg daily. Stopped diltiazem . Continue eliquis  (given age and CKD qualified for dose reduction for Eliquis  reduced to 2.5 BID, now increased back to 5mg  BID given improvement in Cr). Resumed aldactone on discharge (held to allow room for HR control titration, BP appropriate on discharge). Follow up with outpatient cardiology (tried to get appointment for patient prior to leaving hospital but clinic reports they will call patient within 24 hours with new appointment)  On day of discharge, Ms Haughton is feeling well. She denies fever, chills, chest pain, SOB, palpitations. Telemetry overnight stable in sinus rhythm around 50-60 BPM. Has been up ambulating around the room without issue. Stable for discharge, return precautions discussed.   AKIon CKD3- resolved  Suspect AKI in the setting of afib with RVR as above. Received IVF with improvement in Cr, hold aldactone held but resumed on dsicharge. Follows closely with nephrology outpatient.  HLD CAD HTN Chronic, BP well controlled, ruled out for ACS. Continue home statin. Decreased metop as above  Sleep apnea Patient reports she is unaware of sleep apnea diagnosis and does not use a CPAP. Outpatient follow up.   Surgeries and Procedures Performed: none _____________________  Discharge Exam:  BP 127/63 (BP Location: Left upper arm, Patient Position: Lying)   Pulse 56   Temp 36.5 C (97.7 F) (Axillary)   Resp 16   Ht 162.6 cm (5' 4)   Wt 76.3 kg (168 lb 3.4 oz)   SpO2 92%   BMI 28.87 kg/m    Physical Exam:  Gen: alert, cooperative, in NAD HEENT: conjunctiva clear, anicteric sclera oropharynx  clear, moist mucous membranes CV: without murmurs, rubs or gallops, bradycardic, regular rhythm  Resp: clear to auscultation, good air exchange Abd: soft, nontender, nondistended, normoactive bowel sounds  Extremities: no lower extremity edema Skin: no rashes or lesions Psych: oriented to time, place and person, mood and affect are approprate Neuro: no focal neurological deficits, moves all extremities well and no involuntary movements  Pertinent Lab Testing: Recent Labs  Lab 05/02/21 0452 05/03/21 1637 05/05/21 0522  NA 142 140 142  K 4.0 4.0 4.2  CL 112* 109* 112*  CO2 23 21 24   BUN 16 15 18   CREATININE 1.3* 1.3* 1.2*  GLUCOSE 100 144* 98  CALCIUM  8.1* 8.8 8.9   Recent Labs  Lab 04/30/21 2044  AST 21  ALT 19  ALKPHOS 59  TBILI 0.7    Recent Labs  Lab 04/30/21 2044 05/01/21 0620  WBC 7.5 7.5  HGB 12.5 12.1  HCT 36.2 36.4  PLT 210 173  Recent Labs  Lab 04/30/21 2044  APTT 33.1  INR 1.6*      Recent Labs  Lab 05/01/21 1214 05/03/21 1637 05/03/21 2206  TROPONINI 0.01 0.01 0.01    Ref. Range 04/30/2021 23:39  Coronavirus (COVID-19) SARS-CoV-2 PCR Latest Ref Range: Not Detected  Not Detected    Ref. Range 05/01/2021 06:20  TSH Latest Ref Range: 0.34 - 5.66 IU/mL 2.97  Thyroxine, Free (FT4) Latest Ref Range: 0.52 - 1.21 ng/dL 9.16    Ref. Range 04/30/2021 20:44  BNP (Brain Natriuretic Peptide) Latest Ref Range: <=100.0 pg/mL 119.0 (H)    Pertinent Imaging:   Echo complete Result Date: 05/01/2021 INTERPRETATION NORMAL LEFT VENTRICULAR SYSTOLIC FUNCTION   WITH MILD LVH NORMAL RIGHT VENTRICULAR SYSTOLIC FUNCTION MILD VALVULAR REGURGITATION (See above) NO VALVULAR STENOSIS NO PERICARDIAL EFFUSION COMPARED WITH PRIOR STUDY ON 10/03/2019, NO SIGNIFICANT WMA SEEN  Electronically signed by    Morene Southgate, MD on 05/01/2021 11: 58 AM          Performed By: Martina Drivers, RDCS    Ordering Physician: BARTHOLOMEW CADDY   X-ray chest single view  portable Result Date: 04/30/2021 EXAM: XR CHEST SINGLE VIEW PORTABLE dictated on 04/30/2021 10:03 PM INDICATION: 86 years old Female with atrial fibrillation. COMPARISON:  08/23/2020 and prior chest x-rays. TECHNIQUE: AP portable view of the chest was performed. FINDINGS: Low lung volumes with bibasilar subsegmental atelectasis. Mild blunting of the costophrenic angle suggests small pleural effusions. No pneumothorax. The heart and mediastinum are stable. There are degenerative changes in the spine. No acute osseous abnormalities are identified. IMPRESSION: 1. Low lung volumes with bibasilar subsegmental atelectasis. 2. Suspect small bilateral pleural effusions. Electronically Signed by:  Oneil Haver, MD, [facility] Electronically Signed on:  04/30/2021 10:04 PM  _____________________  Code Status: Full Code Goals of care were not addressed during this admission.  Status on Discharge:  Cognitive: normal ADLs: normal Current activity: Walks occasionally (05/05/21 0901) Current mobility: Slightly limited (05/05/21 0901)  Activity Recommendation: activity as tolerated  Other Discharge Instructions: Services setup at discharge (Home health, Nursing, Infusion, PT/OT): none Wound care: none needed Tubes/lines at discharge: none  Discharge to Outside Facility: Behavioral issues in hospital: none Diet (including supplements/tube feeds): Diet low fat low cholesterol 2 GM NA _____________________  Time spent on discharge process: 45 minutes     ARMANDINA EARNIE BENNE, PA [facility]  05/05/2021   Hospital Contact Information:  Madie Persons [facility]) [facility]) [facility])  Pending tests:  Laboratory: [phone] Microbiology: [phone] Pathology: [phone] Radiology: [phone]  General questions: [phone] Pending tests: Laboratory: [phone] Microbiology: [phone] Pathology: [phone] Radiology: [phone]  General questions:  [phone] Pending tests:  Laboratory: [phone] Microbiology: [phone] Pathology: [phone] Radiology: [phone]  General questions:  [phone]   ------------------------------------------------------------------------------- Attestation signed by Missy Nichole Millman, MD at 05/12/2021  5:22 PM This patient was discussed with Caitrin Friend, PA-C and I was immediately available for assistance.   NICHOLE MILLMAN MISSY, MD ------------------------------------------------------------------------------- "

## 2022-06-29 ENCOUNTER — Ambulatory Visit
Admission: EM | Admit: 2022-06-29 | Discharge: 2022-06-29 | Disposition: A | Discharge status: Home / Self Care | Payer: Medicare Other | Attending: Emergency Medicine | Admitting: Emergency Medicine

## 2022-06-29 DIAGNOSIS — R21 Rash and other nonspecific skin eruption: Secondary | ICD-10-CM

## 2022-06-29 MED ORDER — METHYLPREDNISOLONE 4 MG PO TBPK: ORAL_TABLET | ORAL | 0 refills | Status: DC

## 2022-06-29 MED ORDER — CETIRIZINE HCL 5 MG PO TABS: 5.0000 mg | ORAL_TABLET | Freq: Every day | ORAL | 0 refills | Status: AC

## 2022-06-29 NOTE — ED Provider Notes (Signed)
[facility]    CSN: [id number] Arrival date & time: 06/29/22  1316      History   Chief Complaint Chief Complaint  Patient presents with   Rash    HPI Cynthia Conley is a 87 y.o. female.   HPI  87 year old female here for evaluation of skin complaint.  Patient reports that she has had itching on her scalp for the past 3 days.  It is most prominent along the back of her neck.  She states that this also started to spread down onto her body to include her left shoulder.  There is a dog in the house where she lives but she has been on the dog for a year without any issues.  She states that after she scratches the area it will burn.  She denies any new personal hygiene products, new medications, denies fever or drainage.  Past Medical History:  Diagnosis Date   Dysrhythmia    GERD (gastroesophageal reflux disease)    Hyperlipemia    Hypertension    IBS (irritable bowel syndrome) 06/09/2018   Peripheral vascular disease [facility])     Patient Active Problem List   Diagnosis Date Noted   IGT (impaired glucose tolerance) 04/23/2020   CRI (chronic renal insufficiency), stage 3 (moderate) (HCC) 05/02/2019   Rectal bleeding    CAD (coronary artery disease) 06/09/2018   Allergic rhinitis 06/09/2018   DJD (degenerative joint disease) 06/09/2018   GERD (gastroesophageal reflux disease) 06/09/2018   Paroxysmal atrial fibrillation ([place]) 05/13/2017   Radiculopathy of lumbar region 01/10/2017   Biceps tendinitis of right upper extremity 07/05/2016   Disorder of right rotator cuff 07/05/2016   Lumbosacral spondylosis without myelopathy 07/05/2016   Myofascial pain 07/05/2016   Lumbar spinal stenosis 06/28/2016   Spondylolisthesis of lumbar region 06/28/2016   Frequent PVCs 02/25/2016   Degenerative arthritis of cervical spine 12/29/2015   Chronic pain of right knee 10/16/2015   Chronic right shoulder pain 09/15/2015   Overactive bladder 06/06/2015   Benign essential  hypertension 04/18/2015   Moderate mitral insufficiency 11/19/2014   Moderate tricuspid insufficiency 11/19/2014   Cervical radiculitis 07/05/2014   Elevated fasting blood sugar 06/07/2014   Mixed hyperlipidemia 04/18/2014    Past Surgical History:  Procedure Laterality Date   ABDOMINAL HYSTERECTOMY     CHOLECYSTECTOMY     COLONOSCOPY     COLONOSCOPY WITH PROPOFOL N/A 09/14/2018   Procedure: COLONOSCOPY WITH PROPOFOL;  Surgeon: Lin Landsman, MD;  Location: [facility] ENDOSCOPY;  Service: Gastroenterology;  Laterality: N/A;    OB History   No obstetric history on file.      Home Medications    Prior to Admission medications   Medication Sig Start Date End Date Taking? Authorizing Provider  albuterol (PROAIR HFA) 108 (90 Base) MCG/ACT inhaler INHALE 2 INHALATIONS INTO THE LUNGS EVERY 6 HOURS AS NEEDED 05/16/17  Yes [provider]  apixaban (ELIQUIS) 5 MG TABS tablet Take by mouth. 01/02/18  Yes [provider]  atorvastatin (LIPITOR) 20 MG tablet TAKE 1 TABLET BY MOUTH ONCE DAILY 08/03/17  Yes [provider]  cetirizine (ZYRTEC) 5 MG tablet Take 1 tablet (5 mg total) by mouth daily. 06/29/22  Yes Margarette Canada, NP  CYANOCOBALAMIN IJ Inject 1,000 mcg as directed every 30 (thirty) days.   Yes [provider]  diltiazem (CARDIZEM CD) 240 MG 24 hr capsule Take by mouth. 09/28/17  Yes [provider]  diltiazem (TIAZAC) 360 MG 24 hr capsule TAKE 1 CAPSULE(360  MG) BY MOUTH EVERY DAY 02/22/20  Yes [provider]  doxycycline (VIBRAMYCIN) 100 MG capsule Take 1 capsule (100 mg total) by mouth 2 (two) times daily. 12/06/18  Yes Cook, Jayce G, DO  esomeprazole (NEXIUM) 40 MG capsule Take 40 mg by mouth daily at 12 noon.   Yes [provider]  fluorouracil (EFUDEX) 5 % cream Apply 1 application topically 2 (two) times daily.   Yes [provider]  FLUoxetine (PROZAC) 10 MG capsule Take 20 mg by mouth daily.    Yes [provider]  fluticasone (FLONASE) 50 MCG/ACT nasal spray Place 2 sprays into both nostrils daily.   Yes [provider]  gabapentin (NEURONTIN) 300 MG capsule Take 300 mg by mouth at bedtime.   Yes [provider]  hydrocortisone (ANUSOL-HC) 2.5 % rectal cream APPLY TOPICALLY TO THE AFFECTED AREA THREE TIMES DAILY FOR 10 DAYS 04/23/20  Yes Vanga, Tally Due, MD  lidocaine (LIDODERM) 5 % Place 1 patch onto the skin daily. 12/03/16  Yes Daymon Larsen, MD  methylPREDNISolone (MEDROL DOSEPAK) 4 MG TBPK tablet Take according to the package insert. 06/29/22  Yes Margarette Canada, NP  metoprolol succinate (TOPROL-XL) 50 MG 24 hr tablet Take by mouth. 02/21/18  Yes [provider]  nitroGLYCERIN (NITROSTAT) 0.4 MG SL tablet DIS 1 T UNDER THE TONGUE Q 5 MINUTES PRF CHEST PAIN. MY TAKE UP TO 3 DOSES 08/21/18  Yes [provider]  oxybutynin (DITROPAN-XL) 5 MG 24 hr tablet Take 5 mg by mouth at bedtime.   Yes [provider]  spironolactone (ALDACTONE) 25 MG tablet TAKE 1 TABLET(25 MG) BY MOUTH EVERY DAY 05/22/15  Yes [provider]  traMADol (ULTRAM) 50 MG tablet Take 1 tablet (50 mg total) by mouth every 8 (eight) hours as needed. 11/28/16  Yes Cook, Jayce G, DO  valACYclovir (VALTREX) 1000 MG tablet Take 1,000 mg by mouth as needed.   Yes [provider]    Family History History reviewed. No pertinent family history.  Social History Social History   Tobacco Use   Smoking status: Never   Smokeless tobacco: Never  Vaping Use   Vaping Use: Never used  Substance Use Topics   Alcohol use: No   Drug use: Never     Allergies   Codeine and Hydrochlorothiazide   Review of Systems Review of Systems  Constitutional:  Negative for fever.  Skin:  Positive for rash. Negative for color change and wound.     Physical Exam Triage Vital Signs ED Triage Vitals  Enc Vitals Group     BP 06/29/22 1356 117/77     Pulse Rate 06/29/22 1356  (!) 56     Resp 06/29/22 1356 18     Temp 06/29/22 1356 98.3 F (36.8 C)     Temp Source 06/29/22 1356 Oral     SpO2 06/29/22 1356 97 %     Weight 06/29/22 1353 170 lb (77.1 kg)     Height 06/29/22 1353 '5\' 4"'$  (1.626 m)     Head Circumference --      Peak Flow --      Pain Score 06/29/22 1353 0     Pain Loc --      Pain Edu? --      Excl. in [place]? --    No data found.  Updated Vital Signs BP 117/77 (BP Location: Left Arm)   Pulse (!) 56   Temp 98.3 F (36.8 C) (Oral)  Resp 18   Ht '5\' 4"'$  (1.626 m)   Wt 170 lb (77.1 kg)   LMP 04/09/1975   SpO2 97%   BMI 29.18 kg/m   Visual Acuity Right Eye Distance:   Left Eye Distance:   Bilateral Distance:    Right Eye Near:   Left Eye Near:    Bilateral Near:     Physical Exam Vitals and nursing note reviewed.  Constitutional:      Appearance: Normal appearance. She is not ill-appearing.  HENT:     Head: Normocephalic and atraumatic.  Skin:    General: Skin is warm and dry.     Capillary Refill: Capillary refill takes less than 2 seconds.     Findings: Rash present.  Neurological:     General: No focal deficit present.     Mental Status: She is alert and oriented to person, place, and time.  Psychiatric:        Mood and Affect: Mood normal.        Behavior: Behavior normal.        Thought Content: Thought content normal.        Judgment: Judgment normal.      UC Treatments / Results  Labs (all labs ordered are listed, but only abnormal results are displayed) Labs Reviewed - No data to display  EKG   Radiology No results found.  Procedures Procedures (including critical care time)  Medications Ordered in UC Medications - No data to display  Initial Impression / Assessment and Plan / UC Course  I have reviewed the triage vital signs and the nursing notes.  Pertinent labs & imaging results that were available during my care of the patient were reviewed by me and considered in my medical decision making (see  chart for details).   Patient is a very pleasant, nontoxic-appearing 87 year old female here for evaluation of a rash that started on her scalp and has now moved to her left shoulder.  The rash has been present for the last 3 days.    The rash consists of erythematous macular lesions along the hairline of the nape of her neck.  I do not see any lesions scattered throughout her scalp where she is complaining of itching.  She has some other lesions on her left shoulder that are identical.  These are not raised and there is no vesicles or pustules.  There are a few scabbed areas along the back of her neck where she has been scratching.  The lesions are not hot and there is no induration or fluctuance.  Exact etiology is unclear but it does appear to be a type of dermatitis.  I will treat the patient with a Medrol Dosepak, Zyrtec, and Benadryl.  If her symptoms do not improve I recommended that she follow-up with dermatology.   Final Clinical Impressions(s) / UC Diagnoses   Final diagnoses:  Rash and nonspecific skin eruption     Discharge Instructions      Take the Medrol dose pack starting tomorrow morning. Take it as directed on the package.  Use the Zyrtec during the day for itching and take 2 OTC Benadryl tablets at night as needed for itching.  If your symptoms do not improve I recommend following up with dermatology.     ED Prescriptions     Medication Sig Dispense Auth. Provider   methylPREDNISolone (MEDROL DOSEPAK) 4 MG TBPK tablet Take according to the package insert. 1 each Margarette Canada, NP   cetirizine (ZYRTEC) 5  MG tablet Take 1 tablet (5 mg total) by mouth daily. 30 tablet Margarette Canada, NP      PDMP not reviewed this encounter.   Margarette Canada, NP 06/29/22 [phone]

## 2022-06-29 NOTE — ED Triage Notes (Signed)
Pt c/o rash along scalp that is spreading to the body x3days  Pt is with her daughter

## 2022-06-29 NOTE — Discharge Instructions (Signed)
Take the Medrol dose pack starting tomorrow morning. Take it as directed on the package.  Use the Zyrtec during the day for itching and take 2 OTC Benadryl tablets at night as needed for itching.  If your symptoms do not improve I recommend following up with dermatology.

## 2023-03-14 ENCOUNTER — Other Ambulatory Visit: Payer: Self-pay | Admitting: Family Medicine

## 2023-03-14 DIAGNOSIS — R1011 Right upper quadrant pain: Secondary | ICD-10-CM

## 2023-03-16 ENCOUNTER — Ambulatory Visit
Admission: RE | Admit: 2023-03-16 | Discharge: 2023-03-16 | Disposition: A | Discharge status: Home / Self Care | Payer: 59 | Source: Ambulatory Visit | Attending: Family Medicine | Admitting: Family Medicine

## 2023-03-16 DIAGNOSIS — R1011 Right upper quadrant pain: Secondary | ICD-10-CM | POA: Insufficient documentation

## 2023-10-29 ENCOUNTER — Encounter: Payer: Self-pay | Admitting: Emergency Medicine

## 2023-10-29 ENCOUNTER — Ambulatory Visit
Admission: EM | Admit: 2023-10-29 | Discharge: 2023-10-29 | Disposition: A | Discharge status: Home / Self Care | Payer: 59 | Attending: Family Medicine | Admitting: Family Medicine

## 2023-10-29 DIAGNOSIS — D692 Other nonthrombocytopenic purpura: Secondary | ICD-10-CM | POA: Diagnosis present

## 2023-10-29 LAB — CBC WITH DIFFERENTIAL/PLATELET
Abs Immature Granulocytes: 0.03 10*3/uL (ref 0.00–0.07)
Basophils Absolute: 0.1 10*3/uL (ref 0.0–0.1)
Basophils Relative: 1 %
Eosinophils Absolute: 0.4 10*3/uL (ref 0.0–0.5)
Eosinophils Relative: 5 %
HCT: 38 % (ref 36.0–46.0)
Hemoglobin: 12.7 g/dL (ref 12.0–15.0)
Immature Granulocytes: 0 %
Lymphocytes Relative: 26 %
Lymphs Abs: 2.3 10*3/uL (ref 0.7–4.0)
MCH: 30.9 pg (ref 26.0–34.0)
MCHC: 33.4 g/dL (ref 30.0–36.0)
MCV: 92.5 fL (ref 80.0–100.0)
Monocytes Absolute: 0.7 10*3/uL (ref 0.1–1.0)
Monocytes Relative: 8 %
Neutro Abs: 5.4 10*3/uL (ref 1.7–7.7)
Neutrophils Relative %: 60 %
Platelets: 227 10*3/uL (ref 150–400)
RBC: 4.11 MIL/uL (ref 3.87–5.11)
RDW: 13.5 % (ref 11.5–15.5)
WBC: 8.8 10*3/uL (ref 4.0–10.5)
nRBC: 0 % (ref 0.0–0.2)

## 2023-10-29 LAB — BASIC METABOLIC PANEL
Anion gap: 7 (ref 5–15)
BUN: 20 mg/dL (ref 8–23)
CO2: 22 mmol/L (ref 22–32)
Calcium: 9.2 mg/dL (ref 8.9–10.3)
Chloride: 109 mmol/L (ref 98–111)
Creatinine, Ser: 1.47 mg/dL — ABNORMAL HIGH (ref 0.44–1.00)
GFR, Estimated: 35 mL/min — ABNORMAL LOW (ref >60)
Glucose, Bld: 122 mg/dL — ABNORMAL HIGH (ref 70–99)
Potassium: 4.1 mmol/L (ref 3.5–5.1)
Sodium: 138 mmol/L (ref 135–145)

## 2023-10-29 MED ORDER — HYDROXYZINE HCL 25 MG PO TABS: 25.0000 mg | ORAL_TABLET | Freq: Three times a day (TID) | ORAL | 0 refills | Status: DC | PRN

## 2023-10-29 NOTE — Discharge Instructions (Signed)
Medication if needed for itching.  Please follow-up with your primary.  If this continues to persist I recommend biopsy to rule out vasculitis.  Take care  Dr. Adriana Simas

## 2023-10-29 NOTE — ED Provider Notes (Signed)
[facility]    CSN: [id number] Arrival date & time: 10/29/23  1141      History   Chief Complaint Chief Complaint  Patient presents with   Rash    HPI  88 year old female with a chronic sense of past medical history including chronic kidney disease presents with rash.  1 week of rash.  Located in lower extremities.  Seems to be worsening.  Associated itching.  No fever.  No abdominal pain.  No other associated symptoms.  No other complaints.  Past Medical History:  Diagnosis Date   Dysrhythmia    GERD (gastroesophageal reflux disease)    Hyperlipemia    Hypertension    IBS (irritable bowel syndrome) 06/09/2018   Peripheral vascular disease [facility])     Patient Active Problem List   Diagnosis Date Noted   IGT (impaired glucose tolerance) 04/23/2020   CRI (chronic renal insufficiency), stage 3 (moderate) (HCC) 05/02/2019   Rectal bleeding    CAD (coronary artery disease) 06/09/2018   Allergic rhinitis 06/09/2018   DJD (degenerative joint disease) 06/09/2018   GERD (gastroesophageal reflux disease) 06/09/2018   Paroxysmal atrial fibrillation (HCC) 05/13/2017   Radiculopathy of lumbar region 01/10/2017   Biceps tendinitis of right upper extremity 07/05/2016   Disorder of right rotator cuff 07/05/2016   Lumbosacral spondylosis without myelopathy 07/05/2016   Myofascial pain 07/05/2016   Lumbar spinal stenosis 06/28/2016   Spondylolisthesis of lumbar region 06/28/2016   Frequent PVCs 02/25/2016   Degenerative arthritis of cervical spine 12/29/2015   Chronic pain of right knee 10/16/2015   Chronic right shoulder pain 09/15/2015   Overactive bladder 06/06/2015   Benign essential hypertension 04/18/2015   Moderate mitral insufficiency 11/19/2014   Moderate tricuspid insufficiency 11/19/2014   Cervical radiculitis 07/05/2014   Elevated fasting blood sugar 06/07/2014   Mixed hyperlipidemia 04/18/2014    Past Surgical History:  Procedure Laterality Date    ABDOMINAL HYSTERECTOMY     CHOLECYSTECTOMY     COLONOSCOPY     COLONOSCOPY WITH PROPOFOL N/A 09/14/2018   Procedure: COLONOSCOPY WITH PROPOFOL;  Surgeon: Toney Reil, MD;  Location: [facility] ENDOSCOPY;  Service: Gastroenterology;  Laterality: N/A;    OB History   No obstetric history on file.      Home Medications    Prior to Admission medications   Medication Sig Start Date End Date Taking? Authorizing Provider  hydrOXYzine (ATARAX) 25 MG tablet Take 1 tablet (25 mg total) by mouth every 8 (eight) hours as needed for itching. 10/29/23  Yes Kristi Norment G, DO  albuterol (PROAIR HFA) 108 (90 Base) MCG/ACT inhaler INHALE 2 INHALATIONS INTO THE LUNGS EVERY 6 HOURS AS NEEDED 05/16/17   [provider]  apixaban (ELIQUIS) 5 MG TABS tablet Take by mouth. 01/02/18   [provider]  atorvastatin (LIPITOR) 20 MG tablet TAKE 1 TABLET BY MOUTH ONCE DAILY 08/03/17   [provider]  cetirizine (ZYRTEC) 5 MG tablet Take 1 tablet (5 mg total) by mouth daily. 06/29/22   Becky Augusta, NP  CYANOCOBALAMIN IJ Inject 1,000 mcg as directed every 30 (thirty) days.    [provider]  diltiazem (CARDIZEM CD) 240 MG 24 hr capsule Take by mouth. 09/28/17   [provider]  diltiazem (TIAZAC) 360 MG 24 hr capsule TAKE 1 CAPSULE(360 MG) BY MOUTH EVERY DAY 02/22/20   [provider]  doxycycline (VIBRAMYCIN) 100 MG capsule Take 1 capsule (100 mg total) by mouth 2 (two) times daily. 12/06/18   Everlene Other  G, DO  esomeprazole (NEXIUM) 40 MG capsule Take 40 mg by mouth daily at 12 noon.    [provider]  fluorouracil (EFUDEX) 5 % cream Apply 1 application topically 2 (two) times daily.    [provider]  FLUoxetine (PROZAC) 10 MG capsule Take 20 mg by mouth daily.     [provider]  fluticasone (FLONASE) 50 MCG/ACT nasal spray Place 2 sprays into both nostrils daily.    [provider]  gabapentin (NEURONTIN) 300 MG capsule  Take 300 mg by mouth at bedtime.    [provider]  hydrocortisone (ANUSOL-HC) 2.5 % rectal cream APPLY TOPICALLY TO THE AFFECTED AREA THREE TIMES DAILY FOR 10 DAYS 04/23/20   Vanga, Loel Dubonnet, MD  lidocaine (LIDODERM) 5 % Place 1 patch onto the skin daily. 12/03/16   Jennye Moccasin, MD  methylPREDNISolone (MEDROL DOSEPAK) 4 MG TBPK tablet Take according to the package insert. 06/29/22   Becky Augusta, NP  metoprolol succinate (TOPROL-XL) 50 MG 24 hr tablet Take by mouth. 02/21/18   [provider]  nitroGLYCERIN (NITROSTAT) 0.4 MG SL tablet DIS 1 T UNDER THE TONGUE Q 5 MINUTES PRF CHEST PAIN. MY TAKE UP TO 3 DOSES 08/21/18   [provider]  oxybutynin (DITROPAN-XL) 5 MG 24 hr tablet Take 5 mg by mouth at bedtime.    [provider]  spironolactone (ALDACTONE) 25 MG tablet TAKE 1 TABLET(25 MG) BY MOUTH EVERY DAY 05/22/15   [provider]  traMADol (ULTRAM) 50 MG tablet Take 1 tablet (50 mg total) by mouth every 8 (eight) hours as needed. 11/28/16   Tommie Sams, DO  valACYclovir (VALTREX) 1000 MG tablet Take 1,000 mg by mouth as needed.    [provider]    Family History History reviewed. No pertinent family history.  Social History Social History   Tobacco Use   Smoking status: Never   Smokeless tobacco: Never  Vaping Use   Vaping status: Never Used  Substance Use Topics   Alcohol use: No   Drug use: Never     Allergies   Codeine and Hydrochlorothiazide   Review of Systems Review of Systems  Skin:  Positive for rash.   Physical Exam Triage Vital Signs ED Triage Vitals  Encounter Vitals Group     BP 10/29/23 1216 131/66     Systolic BP Percentile --      Diastolic BP Percentile --      Pulse Rate 10/29/23 1216 60     Resp 10/29/23 1216 14     Temp 10/29/23 1216 98.5 F (36.9 C)     Temp Source 10/29/23 1216 Oral     SpO2 10/29/23 1216 94 %     Weight 10/29/23 1215 169 lb 15.6 oz (77.1 kg)     Height 10/29/23  1215 5\' 4"  (1.626 m)     Head Circumference --      Peak Flow --      Pain Score 10/29/23 1214 5     Pain Loc --      Pain Education --      Exclude from Growth Chart --    No data found.  Updated Vital Signs BP 131/66 (BP Location: Right Arm)   Pulse 60   Temp 98.5 F (36.9 C) (Oral)   Resp 14   Ht 5\' 4"  (1.626 m)   Wt 77.1 kg   LMP 04/09/1975   SpO2 94%   BMI 29.18 kg/m  Visual Acuity Right Eye Distance:   Left Eye Distance:   Bilateral Distance:    Right Eye Near:   Left Eye Near:    Bilateral Near:     Physical Exam Vitals and nursing note reviewed.  Constitutional:      General: She is not in acute distress.    Appearance: Normal appearance.  HENT:     Head: Normocephalic and atraumatic.  Cardiovascular:     Rate and Rhythm: Normal rate and regular rhythm.  Pulmonary:     Effort: Pulmonary effort is normal.     Breath sounds: Normal breath sounds. No wheezing or rales.  Abdominal:     General: There is no distension.     Palpations: Abdomen is soft.     Tenderness: There is no abdominal tenderness.  Skin:    Comments: Lower extremities with palpable purpura.  Neurological:     Mental Status: She is alert.  Psychiatric:        Mood and Affect: Mood normal.        Behavior: Behavior normal.      UC Treatments / Results  Labs (all labs ordered are listed, but only abnormal results are displayed) Labs Reviewed  BASIC METABOLIC PANEL - Abnormal; Notable for the following components:      Result Value   Glucose, Bld 122 (*)    Creatinine, Ser 1.47 (*)    GFR, Estimated 35 (*)    All other components within normal limits  CBC WITH DIFFERENTIAL/PLATELET    EKG   Radiology No results found.  Procedures Procedures (including critical care time)  Medications Ordered in UC Medications - No data to display  Initial Impression / Assessment and Plan / UC Course  I have reviewed the triage vital signs and the nursing notes.  Pertinent labs  & imaging results that were available during my care of the patient were reviewed by me and considered in my medical decision making (see chart for details).    88 year old female presents with purpura.  Etiology and prognosis unclear at this time.  Concern for underlying vasculitis. She is minimally symptomatic currently.  CBC normal.  Creatinine near baseline.  Most recent creatinine 1.37.  Current creatinine 1.47.  Patient was unable to give a urinalysis today.  Advise follow-up with primary care physician on Monday.  If continues to persist will need biopsy.  Also needs to give urine sample.  Hydroxyzine as needed for itching.  Final Clinical Impressions(s) / UC Diagnoses   Final diagnoses:  Purpura [facility])     Discharge Instructions      Medication if needed for itching.  Please follow-up with your primary.  If this continues to persist I recommend biopsy to rule out vasculitis.  Take care  Dr. Adriana Simas   ED Prescriptions     Medication Sig Dispense Auth. Provider   hydrOXYzine (ATARAX) 25 MG tablet Take 1 tablet (25 mg total) by mouth every 8 (eight) hours as needed for itching. 20 tablet Tommie Sams, DO      PDMP not reviewed this encounter.   Tommie Sams, [place] 10/29/23 1421

## 2023-10-29 NOTE — ED Triage Notes (Signed)
Patient reports red bumps on both her legs that started a week ago.  Patient reports some discomfort in her lower legs.  Patient denies any new medications.  Patient is on a blood thinner.

## 2023-12-25 ENCOUNTER — Emergency Department: Payer: 59

## 2023-12-25 ENCOUNTER — Emergency Department
Admission: EM | Admit: 2023-12-25 | Discharge: 2023-12-25 | Disposition: A | Discharge status: Home / Self Care | Payer: 59 | Attending: Emergency Medicine | Admitting: Emergency Medicine

## 2023-12-25 DIAGNOSIS — S0990XA Unspecified injury of head, initial encounter: Secondary | ICD-10-CM | POA: Diagnosis present

## 2023-12-25 DIAGNOSIS — W07XXXA Fall from chair, initial encounter: Secondary | ICD-10-CM | POA: Diagnosis not present

## 2023-12-25 DIAGNOSIS — T148XXA Other injury of unspecified body region, initial encounter: Secondary | ICD-10-CM

## 2023-12-25 DIAGNOSIS — M25512 Pain in left shoulder: Secondary | ICD-10-CM | POA: Diagnosis not present

## 2023-12-25 DIAGNOSIS — W19XXXA Unspecified fall, initial encounter: Secondary | ICD-10-CM

## 2023-12-25 DIAGNOSIS — M25562 Pain in left knee: Secondary | ICD-10-CM | POA: Diagnosis not present

## 2023-12-25 MED ORDER — ACETAMINOPHEN 325 MG PO TABS
650.0000 mg | ORAL_TABLET | Freq: Once | ORAL | Status: AC
  Administered 2023-12-25: 650 mg via ORAL
  Filled 2023-12-25: qty 2

## 2023-12-25 NOTE — Discharge Instructions (Addendum)

## 2023-12-25 NOTE — ED Provider Triage Note (Signed)
 Emergency Medicine Provider Triage Evaluation Note  Cynthia Conley , a 89 y.o. female  was evaluated in triage.  Pt complains of pain after fall.  Reports she fell out of her recliner after getting her right leg caught.  Review of Systems  Positive: Pain in left shoulder, pain in left knee, neck pain to palpation Negative: Numbness, weakness, chest pain, LOC  Physical Exam  LMP 04/09/1975  Gen:   Awake, no distress  , oriented and conversant Resp:  Normal effort  MSK:   Patient reports pain with passive range of motion of left shoulder although she has full range of motion is able to reach across her body.  She also has normal flexion extension of her left leg but reports that it hurts to do so.  She reports tenderness to palpation over the midline of her cervical spine but is able to flex, extend, and rotate her head from side-to-side.  Medical Decision Making  Medically screening exam initiated at 6:21 AM.  Appropriate orders placed.  Aiysha Jillson was informed that the remainder of the evaluation will be completed by another provider, this initial triage assessment does not replace that evaluation, and the importance of remaining in the ED until their evaluation is complete.  Appropriate imaging ordered.   Loleta Rose, MD 12/25/23 [phone]

## 2023-12-25 NOTE — ED Provider Notes (Signed)
 [facility] Provider Note    Event Date/Time   First MD Initiated Contact with Patient 12/25/23 [phone]     (approximate)   History   Fall   HPI Cynthia Conley is a 89 y.o. female who presents after a fall.  She said that she was getting up out of her recliner and her right leg got stuck, causing her to fall on her knees and her hands.  She was able to use a life alert bracelet to call for help.  She said that she has some pain in her left knee and in her left shoulder but is able to fully move them, just hurts to do so.  She does not believe that she struck her head and did not lose consciousness.  She felt fine before the fall and has had no chest pain, shortness of breath, nausea, nor vomiting.  She has no numbness or weakness in any of her extremities.     Physical Exam   ED Triage Vitals [12/25/23 0613]  Encounter Vitals Group     BP (!) 133/59     Systolic BP Percentile      Diastolic BP Percentile      Pulse Rate 66     Resp 16     Temp 98.3 F (36.8 C)     Temp Source Oral     SpO2      Weight      Height      Head Circumference      Peak Flow      Pain Score      Pain Loc      Pain Education      Exclude from Growth Chart       Most recent vital signs: Vitals:   12/25/23 0613  BP: (!) 133/59  Pulse: 66  Resp: 16  Temp: 98.3 F (36.8 C)    General: Awake, no distress.  No evidence of head trauma. CV:  Good peripheral perfusion.  Resp:  Normal effort. Speaking easily and comfortably, no accessory muscle usage nor intercostal retractions.   Abd:  No distention.  Other:  Patient reports pain with passive range of motion of left shoulder although she has full range of motion is able to reach across her body. She also has normal flexion extension of her left leg but reports that it hurts to do so. She reports tenderness to palpation over the midline of her cervical spine but is able to flex, extend, and rotate her head from  side-to-side.    ED Results / Procedures / Treatments   Labs (all labs ordered are listed, but only abnormal results are displayed) Labs Reviewed - No data to display   RADIOLOGY I viewed and interpreted the patient's CT C-spine, CT head, left shoulder x-rays, and left knee x-rays.  See hospital course for details.   PROCEDURES:  Critical Care performed: No  Procedures    IMPRESSION / MDM / ASSESSMENT AND PLAN / ED COURSE  I reviewed the triage vital signs and the nursing notes.                              Differential diagnosis includes, but is not limited to, mechanical fall, contusion, musculoskeletal strain, fracture, dislocation, listhesis, intracranial hemorrhage.  Patient's presentation is most consistent with acute presentation with potential threat to life or bodily function.  Labs/studies ordered: Radiology studies as described above  Interventions/Medications given:  Medications  acetaminophen (TYLENOL) tablet 650 mg (has no administration in time range)    (Note:  hospital course my include additional interventions and/or labs/studies not listed above.)   Generally reassuring physical exam although the patient is reporting midline neck pain.  I suspect this is musculoskeletal but I will evaluate with CT head and CT cervical spine given the patient's age.  Similarly, she has a reassuring physical exam of her extremities and is able to fully range her arms and her legs, but she is reporting some pain with range of motion in and around her left knee as well as her left proximal humerus, so I will obtain x-rays of the knee and left shoulder to be sure there is no evidence of fracture or dislocation.  Patient agrees with the plan.  Tylenol ordered for pain.  No indication for lab work at this time.   Clinical Course as of 12/25/23 4098  Wynelle Link Dec 25, 2023  0718 CT Cervical Spine Wo Contrast I viewed and interpreted the patient's CT cervical spine and there is no  evidence of acute injury.  I also read the radiologist's report, which confirmed no acute findings. [CF]  1191 CT Head Wo Contrast I viewed and interpreted the patient's CT head.  No evidence of skull fracture or intracranial bleed.  I also read the radiologist's report, which confirmed no acute findings. [CF]  [id number] I viewed and interpreted the patient's knee x-rays and left shoulder x-rays.  No evidence of fracture or dislocation in either joint.  Radiologist commented on the extensive osteoarthritis and probable chronic rotator cuff injury in the patient's left shoulder, but no evidence of acute traumatic injury. [CF]  0719 I we will update the patient and plan for discharge with outpatient follow-up and with recommendations for the use of over-the-counter ibuprofen and Tylenol as needed for pain management. [CF]    Clinical Course User Index [CF] Loleta Rose, MD     FINAL CLINICAL IMPRESSION(S) / ED DIAGNOSES   Final diagnoses:  Fall, initial encounter  Musculoskeletal strain     Rx / DC Orders   ED Discharge Orders     None        Note:  This document was prepared using Dragon voice recognition software and may include unintentional dictation errors.   Loleta Rose, MD 12/25/23 [phone]

## 2023-12-25 NOTE — ED Triage Notes (Signed)
 Pt reports she was getting out of her recliner and her right leg got caught..  Pt fell forward landing on arms and legs. Pt complains of left arm pain.

## 2024-11-02 NOTE — Discharge Summary (Signed)
 ------------------------------------------------------------------------------- Attestation signed by Conley Cynthia Bathe, MD at 11/02/24 1640 I saw and evaluated the patient, participating in the key portions of the service on the day of discharge.  I reviewed the residents note and agree with the discharge plans and disposition. I personally spent 40 minutes in discharge planning services. Cynthia JONELLE Georgina, MD   -------------------------------------------------------------------------------   Physician Discharge Summary HBR 2 BT2 [facility] [address] [place] [address] Dept: [phone] Loc: [phone]   Outpatient Provider Follow Up Tasks:    For PCP: [ ]  Please follow up on patient's word-finding difficulty. CT non-con did not show any acute pathology. Exam was overall non-focal. Suspect that word-finding difficulty may be related to toxic metabolic encephalopathy, given that it largely improved with treatment of her infections. Please assess and determine if further work-up is needed. *We reduced her Eliquis  to 2.5mg  BID for age/renal function dosing.  Identifying Information:  Cynthia Conley 1935/10/08 [id number]  Primary Care Physician: Cynthia Cheryl Menghini, MD   Code Status: DNR and DNI  Admit Date: 11/01/2024  Discharge Date: 11/02/2024   Discharge To: Home  Discharge Service: HBR - General Medicine Floor Team (MED Cynthia Conley)   Discharge Attending Physician: Cynthia Conley Georgina, MD  Discharge Diagnoses:  Principal Problem:   Influenza A (POA: Yes) Resolved Problems:   * No resolved hospital problems. [facility]: [place] Cynthia Conley Course:   Cynthia Conley is a 89 y.o. female with PMH of hypertension, hyperlipidemia, diabetes with stage III chronic kidney disease, GERD, atrial fibrillation, anxiety, spastic bladder, and allergies who presented to [facility] on 11/01/2024 with Influenza A.   AMS  Influenza A  Possible PNA  Possible UTI  Generalized  Weakness  Constipation  Patient presented with one week of generalized weakness, SOB, cough and palpitations (please see below). Per family members, had been acting differently for the preceding 1-2 days and not making sense (Aox4 at baseline). On admission, did display word-finding difficulty but no other focal neurologic signs. On admission, found to have CXR showing possible PNA, CT A/P showing moderate colonic stool burden. Positive for flu A. No leukocytosis, fevers, briefly tachycardic at admission (afib Conley/ RVR) but rates improved after receiving IV fluids and lactate was normal. UA showed large LE and >182 WBCs (patient denied dysuria). Overall, ddx included encephalopathy being driven by a combination of influenza, suspected PNA, possible UTI, and constipation. Did obtain head CT given word-finding difficulty, and this was reassuring. While hospitalized, she was covered for community-acquired pneumonia with ceftriaxone and doxycycline . She was also started on a 5-day course of Tamiflu (11/02/2024 - 11/06/2024). Additional supportive care with guaifenesin for cough and Miralax for bowel regimen. By date of discharge, symptoms had improved. Suspect delirium in the setting of acute infection causing word-finding difficulty- instructed to follow up with PCP. She was discharged home to continue a course of antibiotics with Augmentin and doxycycline .    Tachycardia  Hx atrial fibrillation Does have history of atrial fibrillation. On admission, reported subjective palpitations intermittently throughout the preceding week. EKG on admission showed sinus tachycardia Conley/ 1st degree AV block. HR up to 111 at admission, improved to 60s-70s after administration of 1L NS bolus. Troponin normal x2. Blood pressures stable with MAPs in 70s-80s. During admission, was monitored on continuous telemetry. Also monitored daily BMP and magnesium  levels. She was continued on her home  metoprolol  succinate and flecainide  (rhythm  control). When CT head was normal (obtained due to word-finding difficulty on presentation), her home Eliquis   was resumed and continued throughout admission. Dose was reduced for age/renal function.   AKI on CKD III  On admission found to have Cr of 1.90, increased from 1.4 on 04/19/24. Suspected prerenal AKI in the setting of acute illnesses as above. Baseline Cr over last year seemed to be 1.4-1.6. UA, as above, showed possible UTI but no significant proteinuria or glucosuria. Received 1L fluid in ED. During admission, renally dosed medications and avoided NSAIDs and other nephrotoxic agents. BMP was monitored daily  with improvement in creatinine.    Chronic Problems  HLD - continued on home atorvastatin  throughout admission. GERD - continued on pharmacy formulary equivalent of home Nexium during admission.   Spastic bladder - continued on home oxybutynin  throughout admission.  Touchbase with Outpatient Provider: Warm Handoff: Completed on 11/02/2024 by Cynthia Ruth, MD  (Intern) via [facility] Message  Procedures: None  Nutrition Assessment:      ______________________________________________________________________ Discharge Medications:    Your Medication List     STOP taking these medications    doxycycline  100 MG capsule Commonly known as: VIBRAMYCIN  Replaced by: doxycycline  100 MG tablet   FLUoxetine 10 MG capsule Commonly known as: PROZAC   lidocaine  5 % patch Commonly known as: LIDODERM    nitroglycerin  0.4 MG SL tablet Commonly known as: NITROSTAT    PROCTOZONE -HC 2.5 % rectal cream Generic drug: hydrocortisone    spironolactone 25 MG tablet Commonly known as: ALDACTONE   traMADol  50 mg tablet Commonly known as: ULTRAM    valACYclovir 1000 MG tablet Commonly known as: VALTREX       START taking these medications    acetaminophen  325 MG tablet Commonly known as: TYLENOL  Take 2 tablets (650 mg total) by mouth every six (6) hours as needed.    amoxicillin-clavulanate 500-125 mg per tablet Commonly known as: AUGMENTIN Take 1 tablet by mouth two (2) times a day for 5 days. Start taking on: November 03, 2024   CLEARLAX 17 gram packet Generic drug: polyethylene glycol Mix 1 packet (17 grams) in 4 to 8 ounces of water, tea, juice, soda or coffee and drink by mouth daily for 14 days. Start taking on: November 03, 2024   doxycycline  100 MG tablet Commonly known as: VIBRA -TABS Take 1 tablet (100 mg total) by mouth two (2) times a day for 4 days. Start taking on: November 03, 2024 Replaces: doxycycline  100 MG capsule   guaiFENesin 100 mg/5 mL syrup Commonly known as: ROBITUSSIN Take 10 mL (200 mg total) by mouth every four (4) hours as needed for cough.   oseltamivir 30 MG capsule Commonly known as: TAMIFLU Take 1 capsule (30 mg total) by mouth daily. Start taking on: November 03, 2024   senna 8.6 mg tablet Commonly known as: SENOKOT Take 2 tablets by mouth nightly as needed for constipation.       CHANGE how you take these medications    apixaban  5 mg Tab Commonly known as: ELIQUIS  Take 1/2 tablet (2.5 mg total) by mouth two (2) times a day. What changed: how much to take       CONTINUE taking these medications    albuterol 90 mcg/actuation inhaler Commonly known as: PROVENTIL HFA;VENTOLIN HFA Inhale 2 puffs.   atorvastatin  20 MG tablet Commonly known as: LIPITOR   cyanocobalamin (vitamin B-12) 1,000 mcg/mL injection Inject 1 mL (1,000 mcg total) into the muscle.   dilTIAZem  360 MG 24 hr capsule Commonly known as: CARDIZEM  CD   esomeprazole 40 MG capsule Commonly known as: NEXIUM   flecainide  50  MG tablet Commonly known as: TAMBOCOR  Take 1 tablet (50 mg total) by mouth two (2) times a day.   fluorouracil 5 % cream Commonly known as: EFUDEX Apply topically.   gabapentin  300 MG capsule Commonly known as: NEURONTIN    metoPROLOL  succinate 50 MG 24 hr tablet Commonly known as: Toprol -XL    mupirocin 2 % ointment Commonly known as: BACTROBAN Apply topically two (2) times a day. After vinegar soaks, apply to areas of skin breakdown on the legs. Then cover with gauze   oxybutynin  5 MG 24 hr tablet Commonly known as: DITROPAN -XL   triamcinolone 0.1 % cream Commonly known as: KENALOG Apply topically two (2) times a day as needed.   triamcinolone 0.1 % ointment Commonly known as: KENALOG Apply topically two (2) times a day. Apply to red, scaly areas of rash on the legs. Avoid applying to areas of skin breakdown, where you should instead apply mupirocin.        Allergies: Codeine and Hydrochlorothiazide ______________________________________________________________________ Pending Test Results: Pending Labs     Order Current Status   Blood Culture #1 In process   Blood Culture #2 In process   Strep Group A Culture In process   Urine Culture In process       Most Recent Labs: All lab results last 24 hours -  Recent Results (from the past 24 hours)  ECG 12 Lead   Collection Time: 11/01/24 10:42 PM  Result Value Ref Range   EKG Systolic BP  mmHg   EKG Diastolic BP  mmHg   EKG Ventricular Rate 111 BPM   EKG Atrial Rate 111 BPM   EKG P-R Interval 176 ms   EKG QRS Duration 80 ms   EKG Q-T Interval 334 ms   EKG QTC Calculation 454 ms   EKG Calculated P Axis 68 degrees   EKG Calculated R Axis 30 degrees   EKG Calculated T Axis 46 degrees   QTC Fredericia 410 ms  RAPID INFLUENZA/RSV/COVID PCR   Collection Time: 11/01/24 11:09 PM   Specimen: Nasopharyngeal Swab  Result Value Ref Range   SARS-CoV-2 PCR Negative Negative   Influenza A Positive (A) Negative   Influenza B Negative Negative   RSV Negative Negative  GREEN LITHIUM HEPARIN EXTRA TUBE   Collection Time: 11/01/24 11:09 PM  Result Value Ref Range   Extra Tube Green Lithium Heparin    Comprehensive Metabolic Panel   Collection Time: 11/01/24 11:09 PM  Result Value Ref Range   Sodium 141 135 -  145 mmol/L   Potassium 4.3 3.4 - 4.8 mmol/L   Chloride 103 98 - 107 mmol/L   CO2 23.5 20.0 - 31.0 mmol/L   Anion Gap 15 (H) 5 - 14 mmol/L   BUN 20 9 - 23 mg/dL   Creatinine 8.09 (H) 9.44 - 1.02 mg/dL   BUN/Creatinine Ratio 11    eGFR CKD-EPI (2021) Female 25 (L) >=60 mL/min/1.59m2   Glucose 114 70 - 179 mg/dL   Calcium  9.0 8.7 - 10.4 mg/dL   Albumin 3.7 3.4 - 5.0 g/dL   Total Protein 6.7 5.7 - 8.2 g/dL   Total Bilirubin 0.5 0.3 - 1.2 mg/dL   AST 29 <=65 U/L   ALT 15 10 - 49 U/L   Alkaline Phosphatase 73 46 - 116 U/L  Magnesium    Collection Time: 11/01/24 11:09 PM  Result Value Ref Range   Magnesium  1.7 1.6 - 2.6 mg/dL  TSH   Collection Time: 11/01/24 11:09 PM  Result Value  Ref Range   TSH 3.250 0.550 - 4.780 uIU/mL  CBC Conley/ Differential   Collection Time: 11/01/24 11:09 PM  Result Value Ref Range   WBC 4.7 3.6 - 11.2 10*9/L   RBC 3.85 (L) 3.95 - 5.13 10*12/L   HGB 12.3 11.3 - 14.9 g/dL   HCT 64.3 65.9 - 55.9 %   MCV 92.6 77.6 - 95.7 fL   MCH 31.9 25.9 - 32.4 pg   MCHC 34.4 32.0 - 36.0 g/dL   RDW 86.2 87.7 - 84.7 %   MPV 8.1 6.8 - 10.7 fL   Platelet 193 150 - 450 10*9/L   nRBC 0 <=4 /100 WBCs   Neutrophils % 71.1 %   Lymphocytes % 16.5 %   Monocytes % 10.4 %   Eosinophils % 1.1 %   Basophils % 0.9 %   Absolute Neutrophils 3.4 1.8 - 7.8 10*9/L   Absolute Lymphocytes 0.8 (L) 1.1 - 3.6 10*9/L   Absolute Monocytes 0.5 0.3 - 0.8 10*9/L   Absolute Eosinophils 0.1 0.0 - 0.5 10*9/L   Absolute Basophils 0.0 0.0 - 0.1 10*9/L  ECG 12 Lead   Collection Time: 11/02/24 12:09 AM  Result Value Ref Range   EKG Systolic BP  mmHg   EKG Diastolic BP  mmHg   EKG Ventricular Rate 106 BPM   EKG Atrial Rate 106 BPM   EKG P-R Interval 232 ms   EKG QRS Duration 80 ms   EKG Q-T Interval 384 ms   EKG QTC Calculation 510 ms   EKG Calculated P Axis  degrees   EKG Calculated R Axis 34 degrees   EKG Calculated T Axis 61 degrees   QTC Fredericia 464 ms  Lactate Sepsis, Venous    Collection Time: 11/02/24  1:39 AM  Result Value Ref Range   Lactate, Venous 1.5 0.5 - 1.8 mmol/L  Strep Group A Rapid   Collection Time: 11/02/24  1:39 AM   Specimen: Throat  Result Value Ref Range   Rapid Strep A Screen Negative Negative  hsTroponin I (serial 0-2-6H Conley/ delta)   Collection Time: 11/02/24  1:39 AM  Result Value Ref Range   hsTroponin I 16 <=34 ng/L  Urinalysis with Microscopy with Culture Reflex   Collection Time: 11/02/24  1:40 AM  Result Value Ref Range   Color, UA Yellow    Clarity, UA Turbid    Specific Gravity, UA 1.012 1.003 - 1.030   pH, UA 5.5 5.0 - 9.0   Leukocyte Esterase, UA Large (A) Negative   Nitrite, UA Negative Negative   Protein, UA Trace (A) Negative   Glucose, UA Negative Negative   Ketones, UA Negative Negative   Urobilinogen, UA <2.0 mg/dL <7.9 mg/dL   Bilirubin, UA Negative Negative   Blood, UA Trace (A) Negative   RBC, UA 1 <=4 /HPF   WBC, UA >182 (H) 0 - 5 /HPF   Squam Epithel, UA 1 0 - 5 /HPF   Bacteria, UA None Seen None Seen /HPF   Hyaline Casts, UA 6 (H) 0 - 1 /LPF  ECG 12 Lead   Collection Time: 11/02/24  3:27 AM  Result Value Ref Range   EKG Systolic BP  mmHg   EKG Diastolic BP  mmHg   EKG Ventricular Rate 70 BPM   EKG Atrial Rate 70 BPM   EKG P-R Interval 178 ms   EKG QRS Duration 78 ms   EKG Q-T Interval 460 ms   EKG QTC Calculation 496 ms  EKG Calculated P Axis 67 degrees   EKG Calculated R Axis 49 degrees   EKG Calculated T Axis 72 degrees   QTC Fredericia 484 ms  hsTroponin I - 2 Hour   Collection Time: 11/02/24  3:30 AM  Result Value Ref Range   hsTroponin I 15 <=34 ng/L   delta hsTroponin I 1 <=7 ng/L  Basic metabolic panel   Collection Time: 11/02/24  6:07 AM  Result Value Ref Range   Sodium 145 135 - 145 mmol/L   Potassium 4.1 3.4 - 4.8 mmol/L   Chloride 107 98 - 107 mmol/L   CO2 23.3 20.0 - 31.0 mmol/L   Anion Gap 15 (H) 5 - 14 mmol/L   BUN 18 9 - 23 mg/dL   Creatinine 8.35 (H) 9.44 - 1.02 mg/dL    BUN/Creatinine Ratio 11    eGFR CKD-EPI (2021) Female 30 (L) >=60 mL/min/1.77m2   Glucose 94 70 - 179 mg/dL   Calcium  8.0 (L) 8.7 - 10.4 mg/dL  CBC   Collection Time: 11/02/24  6:07 AM  Result Value Ref Range   WBC 4.2 3.6 - 11.2 10*9/L   RBC 3.46 (L) 3.95 - 5.13 10*12/L   HGB 10.7 (L) 11.3 - 14.9 g/dL   HCT 68.2 (L) 65.9 - 55.9 %   MCV 91.7 77.6 - 95.7 fL   MCH 31.0 25.9 - 32.4 pg   MCHC 33.9 32.0 - 36.0 g/dL   RDW 86.3 87.7 - 84.7 %   MPV 7.3 6.8 - 10.7 fL   Platelet 162 150 - 450 10*9/L  Magnesium  Level   Collection Time: 11/02/24  6:07 AM  Result Value Ref Range   Magnesium  1.6 1.6 - 2.6 mg/dL   CBC - Results in Past 30 Days Result Component Current Result Ref Range Previous Result Ref Range  HCT 31.7 (L) (11/02/2024) 34.0 - 44.0 % 35.6 (11/01/2024) 34.0 - 44.0 %  HGB 10.7 (L) (11/02/2024) 11.3 - 14.9 g/dL 87.6 (87/74/7974) 88.6 - 14.9 g/dL  MCH 68.9 (87/73/7974) 25.9 - 32.4 pg 31.9 (11/01/2024) 25.9 - 32.4 pg  MCHC 33.9 (11/02/2024) 32.0 - 36.0 g/dL 65.5 (87/74/7974) 67.9 - 36.0 g/dL  MCV 08.2 (87/73/7974) 77.6 - 95.7 fL 92.6 (11/01/2024) 77.6 - 95.7 fL  MPV 7.3 (11/02/2024) 6.8 - 10.7 fL 8.1 (11/01/2024) 6.8 - 10.7 fL  Platelet 162 (11/02/2024) 150 - 450 10*9/L 193 (11/01/2024) 150 - 450 10*9/L  RBC 3.46 (L) (11/02/2024) 3.95 - 5.13 10*12/L 3.85 (L) (11/01/2024) 3.95 - 5.13 10*12/L  WBC 4.2 (11/02/2024) 3.6 - 11.2 10*9/L 4.7 (11/01/2024) 3.6 - 11.2 10*9/L   BMP - Results in Past 30 Days Result Component Current Result Ref Range Previous Result Ref Range  BUN 18 (11/02/2024) 9 - 23 mg/dL 20 (87/74/7974) 9 - 23 mg/dL  Chloride 892 (87/73/7974) 98 - 107 mmol/L 103 (11/01/2024) 98 - 107 mmol/L  CO2 23.3 (11/02/2024) 20.0 - 31.0 mmol/L 23.5 (11/01/2024) 20.0 - 31.0 mmol/L  Creatinine 1.64 (H) (11/02/2024) 0.55 - 1.02 mg/dL 8.09 (H) (87/74/7974) 9.44 - 1.02 mg/dL  Glucose 94 (87/73/7974) 70 - 179 mg/dL 885 (87/74/7974) 70 - 820 mg/dL  Potassium 4.1 (87/73/7974) 3.4 - 4.8  mmol/L 4.3 (11/01/2024) 3.4 - 4.8 mmol/L  Sodium 145 (11/02/2024) 135 - 145 mmol/L 141 (11/01/2024) 135 - 145 mmol/L    Relevant Studies/Radiology: ECG 12 Lead Result Date: 11/02/2024 NORMAL SINUS RHYTHM WITH SINUS ARRHYTHMIA CANNOT RULE OUT INFERIOR INFARCT , AGE UNDETERMINED CANNOT RULE OUT ANTERIOR INFARCT  (CITED ON OR BEFORE 01-Nov-2024) ABNORMAL  ECG WHEN COMPARED WITH ECG OF 02-Nov-2024 00:09, PREMATURE SUPRAVENTRICULAR BEATS ARE NO LONGER PRESENT PR INTERVAL HAS DECREASED VENT. RATE HAS DECREASED by  36 bpm Confirmed by Von Shawl [phone]) on 11/02/2024 2:09:59 PM  ECG 12 Lead Result Date: 11/02/2024 BRIEF EPISODES OF ATRIAL FIBRILLATION/CONSECUTIVE PREMATURE ATRIAL BEATS WITH INTERVENING SINUS BEATS CANNOT RULE OUT ANTERIOR INFARCT  (CITED ON OR BEFORE 01-Nov-2024) ABNORMAL ECG WHEN COMPARED WITH ECG OF 01-Nov-2024 22:42, NO SIGNIFICANT CHANGE WAS FOUND Confirmed by Von Shawl [phone]) on 11/02/2024 2:08:44 PM  ECG 12 Lead Result Date: 11/02/2024 SINUS RHYTHM WITH FREQUENT AND CONSECUTIVE PREMATURE ATRIAL BEATS POSSIBLE INFERIOR INFARCT , AGE UNDETERMINED POSSIBLE ANTEROLATERAL INFARCT  , AGE UNDETERMINED ABNORMAL ECG WHEN COMPARED WITH ECG OF 15-Aug-2020 13:20, VENT. RATE HAS INCREASED by  55 bpm BORDERLINE CRITERIA FOR ANTERIOR INFARCT  ARE NOW PRESENT BORDERLINE CRITERIA FOR ANTEROLATERAL INFARCT  ARE NOW PRESENT BORDERLINE CRITERIA FOR INFERIOR INFARCT ARE NOW PRESENT T WAVE AMPLITUDE HAS DECREASED IN ANTERIOR LEADS Confirmed by Von Shawl [phone]) on 11/02/2024 2:06:27 PM  CT Head Wo Contrast Result Date: 11/02/2024 EXAM: Computed tomography, head or brain without contrast material. ACCESSION: [id number] UN CLINICAL INDICATION: 89 years old Female with new word finding difficulty, on Eliquis  at home  COMPARISON: None TECHNIQUE: Axial CT images of the head from skull base to vertex without contrast. FINDINGS: There is mild age-related global cerebral volume loss normal caliber  of the ventricular system. Small foci of hypoattenuation in the bilateral gangliocapsular regions, favored to be chronic lacunar infarcts. Small foci of chronic cortical infarct in the left cerebellum. Scattered and confluent hypodensities within the white matter consistent with chronic microvascular ischemic changes. There is no midline shift. No mass lesion. There is no evidence of large territory acute infarct. Mucosal thickening of the bilateral maxillary and sphenoid sinuses with layering fluid and frothy secretions. Mucosal thickening of the ethmoid air cells. Atherosclerotic vascular calcifications. No intracranial hemorrhage or skull fractures.   No acute appearing intracranial abnormality. Probable chronic lacunar infarcts in the bilateral gangliocapsular regions.   CT Abdomen Pelvis Without Any  Contrast Result Date: 11/02/2024 EXAM: CT ABDOMEN PELVIS WO CONTRAST ACCESSION: [id number] UN REPORT DATE: 11/02/2024 3:19 AM CLINICAL INDICATION: 89 years old with Right lower quadrant tenderness to palpation  COMPARISON: None. TECHNIQUE: A spiral CT scan was obtained without IV contrast from the lung bases to the pubic symphysis.  Images were reconstructed in the axial plane. Coronal and sagittal reformatted images were also provided for further evaluation. Evaluation of the solid organs and vasculature is limited in the absence of intravenous contrast. FINDINGS: LOWER CHEST: Bibasilar atelectasis/scarring. LIVER: Normal liver contour. No focal liver lesion on non-contrast examination. BILIARY: The gallbladder is surgically absent. No intrahepatic biliary ductal dilatation. SPLEEN: Normal in size and contour. PANCREAS: Fatty atrophy of the pancreas. No ductal dilatation. No suspicious pancreatic lesion. ADRENAL GLANDS: Normal appearance of the adrenal glands. KIDNEYS/URETERS: Mildly lobulated renal contours. Simple left renal cyst. Additional subcentimeter hypodensities are too small to characterize. No  nephrolithiasis.  No ureteral dilatation or collecting system distention. BLADDER: Mildly distended. REPRODUCTIVE ORGANS: Atrophic or absent uterus. No adnexal mass. GI TRACT: Moderate colonic stool burden with colonic diverticulosis. Normal appendix. No bowel obstruction. Small hiatal hernia. PERITONEUM/RETROPERITONEUM AND MESENTERY: No free air. No ascites. No fluid collection. VASCULATURE: Moderate atherosclerotic disease of the normal caliber abdominal aorta and its major branches. Otherwise, limited evaluation without contrast. LYMPH NODES: No adenopathy. BONES and SOFT TISSUES: Dextroscoliosis of the thoracolumbar spine with moderate multilevel degenerative changes, including  degenerative grade 1 anterolisthesis of L4 on L5. No aggressive osseous lesion. No focal soft tissue lesions.   Moderate colonic stool burden. No bowel obstruction. Other chronic findings as above.   XR Chest 2 views Result Date: 11/01/2024 EXAM: XR CHEST 2 VIEWS ACCESSION: [id number] UN REPORT DATE: 11/01/2024 11:45 PM CLINICAL INDICATION: CHEST PAIN ; Chest Pain  TECHNIQUE: PA and Lateral Chest Radiographs COMPARISON: 08/15/2020 FINDINGS: Cardiomediastinal silhouette is prominent though unchanged. Prominent vascularity is noted. Suggestion of new mild increasing left infrahilar and lung base opacities. Other areas of mild bilateral lung opacities are noted likely representing atelectasis and scarring. No pneumothorax or pleural effusion seen. Degenerative changes.   Suggestion of mild increasing left perihilar and lung base opacities. Atelectasis versus aspiration or pneumonia.   ______________________________________________________________________ Discharge Instructions:  Activity Instructions     Activity as tolerated              Follow Up instructions and Outpatient Referrals    Call MD for:  difficulty breathing, headache or visual disturbances     Call MD for:  persistent nausea or vomiting     Call MD  for:  severe uncontrolled pain     Call MD for:  temperature >38.5 Celsius     Discharge instructions        ______________________________________________________________________ Discharge Day Services: BP (!) 154/75   Pulse 64   Temp 37 C (98.6 F) (Oral)   Resp 18   Ht 162.6 cm (5' 4)   Wt 72 kg (158 lb 11.2 oz)   SpO2 94%   BMI 27.24 kg/m   Pt seen on the day of discharge and determined appropriate for discharge.  Condition at Discharge: fair  Length of Discharge: I spent less than 30 mins in the discharge of this patient.  Cynthia Ruth, MD, PGY-1

## 2024-11-14 ENCOUNTER — Other Ambulatory Visit: Payer: Self-pay

## 2024-11-14 ENCOUNTER — Emergency Department

## 2024-11-14 ENCOUNTER — Encounter: Payer: Self-pay | Admitting: Emergency Medicine

## 2024-11-14 ENCOUNTER — Inpatient Hospital Stay
Admission: EM | Admit: 2024-11-14 | Discharge: 2024-11-16 | DRG: 309 | Disposition: A | Discharge status: Home / Self Care | Attending: Student | Admitting: Student

## 2024-11-14 DIAGNOSIS — J9811 Atelectasis: Secondary | ICD-10-CM | POA: Diagnosis present

## 2024-11-14 DIAGNOSIS — Z66 Do not resuscitate: Secondary | ICD-10-CM | POA: Diagnosis present

## 2024-11-14 DIAGNOSIS — F32A Depression, unspecified: Secondary | ICD-10-CM | POA: Diagnosis present

## 2024-11-14 DIAGNOSIS — I739 Peripheral vascular disease, unspecified: Secondary | ICD-10-CM | POA: Diagnosis present

## 2024-11-14 DIAGNOSIS — Z90710 Acquired absence of both cervix and uterus: Secondary | ICD-10-CM | POA: Diagnosis not present

## 2024-11-14 DIAGNOSIS — K219 Gastro-esophageal reflux disease without esophagitis: Secondary | ICD-10-CM | POA: Diagnosis present

## 2024-11-14 DIAGNOSIS — E872 Acidosis, unspecified: Secondary | ICD-10-CM | POA: Diagnosis present

## 2024-11-14 DIAGNOSIS — Z79899 Other long term (current) drug therapy: Secondary | ICD-10-CM

## 2024-11-14 DIAGNOSIS — J189 Pneumonia, unspecified organism: Secondary | ICD-10-CM | POA: Diagnosis not present

## 2024-11-14 DIAGNOSIS — K580 Irritable bowel syndrome with diarrhea: Secondary | ICD-10-CM | POA: Diagnosis present

## 2024-11-14 DIAGNOSIS — I480 Paroxysmal atrial fibrillation: Principal | ICD-10-CM

## 2024-11-14 DIAGNOSIS — I493 Ventricular premature depolarization: Secondary | ICD-10-CM | POA: Diagnosis present

## 2024-11-14 DIAGNOSIS — Z888 Allergy status to other drugs, medicaments and biological substances status: Secondary | ICD-10-CM

## 2024-11-14 DIAGNOSIS — I129 Hypertensive chronic kidney disease with stage 1 through stage 4 chronic kidney disease, or unspecified chronic kidney disease: Secondary | ICD-10-CM | POA: Diagnosis present

## 2024-11-14 DIAGNOSIS — A419 Sepsis, unspecified organism: Secondary | ICD-10-CM | POA: Diagnosis not present

## 2024-11-14 DIAGNOSIS — Z7901 Long term (current) use of anticoagulants: Secondary | ICD-10-CM

## 2024-11-14 DIAGNOSIS — N1832 Chronic kidney disease, stage 3b: Secondary | ICD-10-CM | POA: Diagnosis present

## 2024-11-14 DIAGNOSIS — E785 Hyperlipidemia, unspecified: Secondary | ICD-10-CM | POA: Diagnosis present

## 2024-11-14 DIAGNOSIS — I5A Non-ischemic myocardial injury (non-traumatic): Secondary | ICD-10-CM | POA: Diagnosis present

## 2024-11-14 DIAGNOSIS — I2510 Atherosclerotic heart disease of native coronary artery without angina pectoris: Secondary | ICD-10-CM | POA: Diagnosis present

## 2024-11-14 DIAGNOSIS — Z885 Allergy status to narcotic agent status: Secondary | ICD-10-CM

## 2024-11-14 DIAGNOSIS — I4891 Unspecified atrial fibrillation: Secondary | ICD-10-CM

## 2024-11-14 LAB — CBC WITH DIFFERENTIAL/PLATELET
Abs Immature Granulocytes: 0.02 K/uL (ref 0.00–0.07)
Basophils Absolute: 0.1 K/uL (ref 0.0–0.1)
Basophils Relative: 1 %
Eosinophils Absolute: 0.2 K/uL (ref 0.0–0.5)
Eosinophils Relative: 4 %
HCT: 36.4 % (ref 36.0–46.0)
Hemoglobin: 12.3 g/dL (ref 12.0–15.0)
Immature Granulocytes: 0 %
Lymphocytes Relative: 35 %
Lymphs Abs: 2.2 K/uL (ref 0.7–4.0)
MCH: 31.1 pg (ref 26.0–34.0)
MCHC: 33.8 g/dL (ref 30.0–36.0)
MCV: 92.2 fL (ref 80.0–100.0)
Monocytes Absolute: 0.5 K/uL (ref 0.1–1.0)
Monocytes Relative: 8 %
Neutro Abs: 3.3 K/uL (ref 1.7–7.7)
Neutrophils Relative %: 52 %
Platelets: 256 K/uL (ref 150–400)
RBC: 3.95 MIL/uL (ref 3.87–5.11)
RDW: 13.7 % (ref 11.5–15.5)
WBC: 6.3 K/uL (ref 4.0–10.5)
nRBC: 0 % (ref 0.0–0.2)

## 2024-11-14 LAB — COMPREHENSIVE METABOLIC PANEL WITH GFR
ALT: 18 U/L (ref 0–44)
AST: 31 U/L (ref 15–41)
Albumin: 3.9 g/dL (ref 3.5–5.0)
Alkaline Phosphatase: 82 U/L (ref 38–126)
Anion gap: 15 (ref 5–15)
BUN: 16 mg/dL (ref 8–23)
CO2: 19 mmol/L — ABNORMAL LOW (ref 22–32)
Calcium: 9.1 mg/dL (ref 8.9–10.3)
Chloride: 103 mmol/L (ref 98–111)
Creatinine, Ser: 1.28 mg/dL — ABNORMAL HIGH (ref 0.44–1.00)
GFR, Estimated: 40 mL/min — ABNORMAL LOW
Glucose, Bld: 104 mg/dL — ABNORMAL HIGH (ref 70–99)
Potassium: 4.2 mmol/L (ref 3.5–5.1)
Sodium: 138 mmol/L (ref 135–145)
Total Bilirubin: 0.4 mg/dL (ref 0.0–1.2)
Total Protein: 6.5 g/dL (ref 6.5–8.1)

## 2024-11-14 LAB — TROPONIN T, HIGH SENSITIVITY
Troponin T High Sensitivity: 20 ng/L — ABNORMAL HIGH (ref 0–19)
Troponin T High Sensitivity: 33 ng/L — ABNORMAL HIGH (ref 0–19)

## 2024-11-14 LAB — URINALYSIS, ROUTINE W REFLEX MICROSCOPIC
Bilirubin Urine: NEGATIVE
Glucose, UA: NEGATIVE mg/dL
Hgb urine dipstick: NEGATIVE
Ketones, ur: NEGATIVE mg/dL
Leukocytes,Ua: NEGATIVE
Nitrite: NEGATIVE
Protein, ur: NEGATIVE mg/dL
Specific Gravity, Urine: 1.002 — ABNORMAL LOW (ref 1.005–1.030)
pH: 6 (ref 5.0–8.0)

## 2024-11-14 LAB — PRO BRAIN NATRIURETIC PEPTIDE: Pro Brain Natriuretic Peptide: 470 pg/mL — ABNORMAL HIGH

## 2024-11-14 LAB — LACTIC ACID, PLASMA
Lactic Acid, Venous: 2.9 mmol/L (ref 0.5–1.9)
Lactic Acid, Venous: 1.9 mmol/L (ref 0.5–1.9)

## 2024-11-14 MED ORDER — ACETAMINOPHEN 325 MG PO TABS: 650.0000 mg | ORAL_TABLET | Freq: Four times a day (QID) | ORAL | Status: DC | PRN

## 2024-11-14 MED ORDER — VANCOMYCIN HCL IN DEXTROSE 1-5 GM/200ML-% IV SOLN
1000.0000 mg | Freq: Once | INTRAVENOUS | Status: AC
  Administered 2024-11-14: 1000 mg via INTRAVENOUS
  Filled 2024-11-14: qty 200

## 2024-11-14 MED ORDER — SODIUM CHLORIDE 0.9 % IV SOLN
2.0000 g | INTRAVENOUS | Status: DC
  Administered 2024-11-15: 2 g via INTRAVENOUS
  Filled 2024-11-14: qty 20

## 2024-11-14 MED ORDER — GABAPENTIN 300 MG PO CAPS
300.0000 mg | ORAL_CAPSULE | Freq: Every day | ORAL | Status: DC
  Administered 2024-11-14 – 2024-11-15 (×2): 300 mg via ORAL
  Filled 2024-11-14 (×2): qty 1

## 2024-11-14 MED ORDER — ATORVASTATIN CALCIUM 20 MG PO TABS
20.0000 mg | ORAL_TABLET | Freq: Every day | ORAL | Status: DC
  Administered 2024-11-15 – 2024-11-16 (×2): 20 mg via ORAL
  Filled 2024-11-14 (×2): qty 1

## 2024-11-14 MED ORDER — ACETAMINOPHEN 325 MG RE SUPP: 650.0000 mg | Freq: Four times a day (QID) | RECTAL | Status: DC | PRN

## 2024-11-14 MED ORDER — METOPROLOL SUCCINATE ER 50 MG PO TB24
50.0000 mg | ORAL_TABLET | Freq: Every day | ORAL | Status: DC
  Administered 2024-11-15: 50 mg via ORAL
  Filled 2024-11-14: qty 1

## 2024-11-14 MED ORDER — PANTOPRAZOLE SODIUM 40 MG PO TBEC
40.0000 mg | DELAYED_RELEASE_TABLET | Freq: Every day | ORAL | Status: DC
  Administered 2024-11-15 – 2024-11-16 (×2): 40 mg via ORAL
  Filled 2024-11-14 (×2): qty 1

## 2024-11-14 MED ORDER — OXYBUTYNIN CHLORIDE ER 5 MG PO TB24
5.0000 mg | ORAL_TABLET | Freq: Every day | ORAL | Status: DC
  Administered 2024-11-14 – 2024-11-15 (×2): 5 mg via ORAL
  Filled 2024-11-14 (×2): qty 1

## 2024-11-14 MED ORDER — ONDANSETRON HCL 4 MG PO TABS: 4.0000 mg | ORAL_TABLET | Freq: Four times a day (QID) | ORAL | Status: DC | PRN

## 2024-11-14 MED ORDER — TRAZODONE HCL 50 MG PO TABS
25.0000 mg | ORAL_TABLET | Freq: Every evening | ORAL | Status: DC | PRN
  Filled 2024-11-14: qty 1

## 2024-11-14 MED ORDER — DILTIAZEM HCL ER 120 MG PO TB24
120.0000 mg | ORAL_TABLET | Freq: Every day | ORAL | Status: DC
  Administered 2024-11-15: 120 mg via ORAL
  Filled 2024-11-14: qty 1

## 2024-11-14 MED ORDER — FLECAINIDE ACETATE 50 MG PO TABS
50.0000 mg | ORAL_TABLET | Freq: Two times a day (BID) | ORAL | Status: DC
  Administered 2024-11-14 – 2024-11-15 (×2): 50 mg via ORAL
  Filled 2024-11-14 (×2): qty 1

## 2024-11-14 MED ORDER — NITROGLYCERIN 0.4 MG SL SUBL: 0.4000 mg | SUBLINGUAL_TABLET | SUBLINGUAL | Status: DC | PRN

## 2024-11-14 MED ORDER — SODIUM CHLORIDE 0.9 % IV SOLN
500.0000 mg | INTRAVENOUS | Status: DC
  Administered 2024-11-14: 500 mg via INTRAVENOUS
  Filled 2024-11-14: qty 5

## 2024-11-14 MED ORDER — MAGNESIUM HYDROXIDE 400 MG/5ML PO SUSP: 30.0000 mL | Freq: Every day | ORAL | Status: DC | PRN

## 2024-11-14 MED ORDER — ONDANSETRON HCL 4 MG/2ML IJ SOLN: 4.0000 mg | Freq: Four times a day (QID) | INTRAMUSCULAR | Status: DC | PRN

## 2024-11-14 MED ORDER — LACTATED RINGERS IV SOLN
150.0000 mL/h | INTRAVENOUS | Status: AC
  Administered 2024-11-14: 150 mL/h via INTRAVENOUS

## 2024-11-14 MED ORDER — DILTIAZEM HCL 25 MG/5ML IV SOLN
5.0000 mg | Freq: Once | INTRAVENOUS | Status: AC
  Administered 2024-11-14: 5 mg via INTRAVENOUS
  Filled 2024-11-14: qty 5

## 2024-11-14 MED ORDER — APIXABAN 2.5 MG PO TABS
2.5000 mg | ORAL_TABLET | Freq: Two times a day (BID) | ORAL | Status: DC
  Administered 2024-11-14 – 2024-11-16 (×4): 2.5 mg via ORAL
  Filled 2024-11-14 (×4): qty 1

## 2024-11-14 MED ORDER — SODIUM CHLORIDE 0.9 % IV SOLN
2.0000 g | Freq: Once | INTRAVENOUS | Status: AC
  Administered 2024-11-14: 2 g via INTRAVENOUS
  Filled 2024-11-14: qty 12.5

## 2024-11-14 MED ORDER — LACTATED RINGERS IV BOLUS
1000.0000 mL | Freq: Once | INTRAVENOUS | Status: AC
  Administered 2024-11-14: 1000 mL via INTRAVENOUS

## 2024-11-14 MED ORDER — FUROSEMIDE 40 MG PO TABS: 20.0000 mg | ORAL_TABLET | Freq: Every day | ORAL | Status: DC | PRN

## 2024-11-14 NOTE — ED Provider Notes (Signed)
 "  [facility] Provider Note    Event Date/Time   First MD Initiated Contact with Patient 11/14/24 1904     (approximate)   History   Shortness of Breath and Palpitations   HPI  Cynthia Conley is a 89 y.o. female with a history of hypertension, hyperlipidemia, diabetes, stage III CKD, GERD, atrial fibrillation and anxiety who presents with palpitations and shortness of breath, acute onset this evening.  The patient states that she felt like her heart was racing very fast.  She felt very weak and lightheaded.  She started to feel short of breath and anxious.  EMS found her to be hypertensive with heart rate in the 180s.  She was given IV Cardizem  with improvement in the heart rate although the patient briefly became hypotensive.  The patient states that she is feeling significantly better.  She denies any chest pain.  She has no new leg swelling.  She has no fever or chills.  I reviewed the past medical records.  The patient was admitted to [facility] on 12/26 with influenza A, possible pneumonia, and altered mental status.  The patient is on Eliquis  for A-fib.   Physical Exam   Triage Vital Signs: ED Triage Vitals  Encounter Vitals Group     BP      Girls Systolic BP Percentile      Girls Diastolic BP Percentile      Boys Systolic BP Percentile      Boys Diastolic BP Percentile      Pulse      Resp      Temp      Temp src      SpO2      Weight      Height      Head Circumference      Peak Flow      Pain Score      Pain Loc      Pain Education      Exclude from Growth Chart     Most recent vital signs: Vitals:   11/14/24 1945 11/14/24 2000  BP: 107/84 115/86  Pulse:  91  Resp: (!) 35 18  Temp:    SpO2: 100% 100%     General: Alert, relatively well-appearing, no distress.  CV:  Good peripheral perfusion.  Tachycardic, irregular rhythm. Resp:  Slightly increased respiratory effort.  Lungs CTAB. Abd:  No distention.  Other:  No significant  peripheral edema.   ED Results / Procedures / Treatments   Labs (all labs ordered are listed, but only abnormal results are displayed) Labs Reviewed  COMPREHENSIVE METABOLIC PANEL WITH GFR - Abnormal; Notable for the following components:      Result Value   CO2 19 (*)    Glucose, Bld 104 (*)    Creatinine, Ser 1.28 (*)    GFR, Estimated 40 (*)    All other components within normal limits  PRO BRAIN NATRIURETIC PEPTIDE - Abnormal; Notable for the following components:   Pro Brain Natriuretic Peptide 470.0 (*)    All other components within normal limits  LACTIC ACID, PLASMA - Abnormal; Notable for the following components:   Lactic Acid, Venous 2.9 (*)    All other components within normal limits  URINALYSIS, ROUTINE W REFLEX MICROSCOPIC - Abnormal; Notable for the following components:   Color, Urine STRAW (*)    APPearance CLEAR (*)    Specific Gravity, Urine 1.002 (*)    All other components within normal limits  TROPONIN T, HIGH SENSITIVITY - Abnormal; Notable for the following components:   Troponin T High Sensitivity 20 (*)    All other components within normal limits  CULTURE, BLOOD (ROUTINE X 2)  CULTURE, BLOOD (ROUTINE X 2)  CBC WITH DIFFERENTIAL/PLATELET  LACTIC ACID, PLASMA  TROPONIN T, HIGH SENSITIVITY     EKG  ED ECG REPORT I, Waylon Cassis, the attending physician, personally viewed and interpreted this ECG.  Date: 11/14/2024 EKG Time: 1916 Rate: 126 Rhythm: Atrial fibrillation with RVR QRS Axis: normal Intervals: Prolonged QTc ST/T Wave abnormalities: normal Narrative Interpretation: no evidence of acute ischemia    RADIOLOGY  Chest x-ray: I independently viewed and interpreted the images; there is a left lower lobe opacity, possibly atelectasis versus infiltrate  PROCEDURES:  Critical Care performed: Yes, see critical care procedure note(s)  .Critical Care  Performed by: Cassis Waylon, MD Authorized by: Cassis Waylon, MD    Critical care provider statement:    Critical care time (minutes):  30   Critical care was time spent personally by me on the following activities:  Development of treatment plan with patient or surrogate, discussions with consultants, evaluation of patient's response to treatment, examination of patient, ordering and review of laboratory studies, ordering and review of radiographic studies, ordering and performing treatments and interventions, pulse oximetry, re-evaluation of patient's condition and review of old charts    MEDICATIONS ORDERED IN ED: Medications  vancomycin  (VANCOCIN ) IVPB 1000 mg/200 mL premix (has no administration in time range)  ceFEPIme  (MAXIPIME ) 2 g in sodium chloride  0.9 % 100 mL IVPB (2 g Intravenous New Bag/Given 11/14/24 2134)  diltiazem  (CARDIZEM ) injection 5 mg (5 mg Intravenous Given by Other 11/14/24 1913)  lactated ringers  bolus 1,000 mL (1,000 mLs Intravenous New Bag/Given 11/14/24 2110)     IMPRESSION / MDM / ASSESSMENT AND PLAN / ED COURSE  I reviewed the triage vital signs and the nursing notes.  89 year old female with PMH as noted above presents with palpitations, shortness of breath, and rapid atrial fibrillation, apparently as high as the 180s or higher when EMS arrived.  She was treated with IV Cardizem  and her heart rate has improved although is now coming back up to the 120s.  Her blood pressure here is mildly elevated.  The patient appears comfortable although with slightly increased work of breathing.  O2 saturation is 100% on room air.  Differential diagnosis includes, but is not limited to, atrial fibrillation, other tachydysrhythmia, dehydration, lecture light abnormality, other metabolic cause, acute infection.  Will obtain chest x-ray, lab workup, give additional IV Cardizem , and reassess.  Patient's presentation is most consistent with acute presentation with potential threat to life or bodily function.  The patient is on the cardiac monitor to  evaluate for evidence of arrhythmia and/or significant heart rate changes.  ----------------------------------------- 9:33 PM on 11/14/2024 -----------------------------------------   The patient's heart rate has significantly improved although she still reports palpitations.  Blood pressure has remained stable.  Lab workup is significant for an elevated lactate.  However there is no evidence of septic shock.  CMP and CBC are unremarkable.  Urinalysis is negative.  Chest x-ray shows a left lower lobe infiltrate.  This may be leftover atelectasis after her recent flu/pneumonia, however given the elevated lactate we will treat empirically for possible HCAP.  The patient will need admission for further management.  I consulted Dr. Lawence from the hospitalist service; based on our discussion he agrees to evaluate the patient for admission.  FINAL CLINICAL IMPRESSION(S) /  ED DIAGNOSES   Final diagnoses:  HCAP (healthcare-associated pneumonia)  Atrial fibrillation with RVR (HCC)     Rx / DC Orders   ED Discharge Orders     None        Note:  This document was prepared using Dragon voice recognition software and may include unintentional dictation errors.    Jacolyn Pae, MD 11/14/24 2155  "

## 2024-11-14 NOTE — ED Notes (Signed)
 Pt daughter at bedside

## 2024-11-14 NOTE — H&P (Signed)
 "     [place]   PATIENT NAME: Cynthia Conley    MR#:  [id number]  DATE OF BIRTH:  Aug 16, 1935  DATE OF ADMISSION:  11/14/2024  PRIMARY CARE PHYSICIAN: Jeffie Cheryl BRAVO, MD   Patient is coming from: Home  REQUESTING/REFERRING PHYSICIAN: Siadecki, Sebastien, MD  CHIEF COMPLAINT:   Chief Complaint  Patient presents with   Shortness of Breath   Palpitations    HISTORY OF PRESENT ILLNESS:  Cynthia Conley is a 89 y.o. Caucasian female with medical history significant for GERD, hypertension, dyslipidemia, IBS, peripheral vascular disease and paroxysmal atrial fibrillation, who presented to the emergency room with acute onset of palpitations with associated dyspnea and presyncope with lightheadedness and weakness without chest pain.  The patient has been having cough productive of yellow sputum initially and later on was unable to expectorate.  She admits to associated dyspnea without wheezing.  She has been having chills.  No nausea or vomiting.  She has been having diarrhea.  No constipation or melena or bright red bleeding per rectum.  No dysuria, oliguria or hematuria or flank pain.  No bleeding diathesis.  She is found to have A-fib with RVR by EMS and was given 10 mg of IV Cardizem .  ED Course: When the patient came to the ER, BP was 150/52 with temperature 97.5 heart rate 115.  Labs revealed a CO2 19 and creatinine 1.28 with a proBNP of 470, lactic acid of 2.9 and later 1.9 high-sensitivity troponin T is 20 and later 33.  CBC showed no abnormalities.  UA was unremarkable.  Blood cultures were drawn. EKG as reviewed by me :  EKG showed atrial fibrillation with RVR 126 with PVCs and prolonged QT interval with QTc 429 MS. Imaging: Portable chest x-ray showed patchy atelectasis in the left lung base.  The patient was given 5 mg of IV Cardizem  here, IV cefepime  and vancomycin  as well as 1 L bolus of IV lactated Ringer  and will be admitted to a telemetry bed for further evaluation and  management. PAST MEDICAL HISTORY:   Past Medical History:  Diagnosis Date   Dysrhythmia    GERD (gastroesophageal reflux disease)    Hyperlipemia    Hypertension    IBS (irritable bowel syndrome) 06/09/2018   Peripheral vascular disease     PAST SURGICAL HISTORY:   Past Surgical History:  Procedure Laterality Date   ABDOMINAL HYSTERECTOMY     CHOLECYSTECTOMY     COLONOSCOPY     COLONOSCOPY WITH PROPOFOL  N/A 09/14/2018   Procedure: COLONOSCOPY WITH PROPOFOL ;  Surgeon: Unk Corinn Skiff, MD;  Location: [facility] ENDOSCOPY;  Service: Gastroenterology;  Laterality: N/A;    SOCIAL HISTORY:   Social History   Tobacco Use   Smoking status: Never   Smokeless tobacco: Never  Substance Use Topics   Alcohol use: No    FAMILY HISTORY:  History reviewed. No pertinent family history.  DRUG ALLERGIES:  Allergies[1]  REVIEW OF SYSTEMS:   ROS As per history of present illness. All pertinent systems were reviewed above. Constitutional, HEENT, cardiovascular, respiratory, GI, GU, musculoskeletal, neuro, psychiatric, endocrine, integumentary and hematologic systems were reviewed and are otherwise negative/unremarkable except for positive findings mentioned above in the HPI.   MEDICATIONS AT HOME:   Prior to Admission medications  Medication Sig Start Date End Date Taking? Authorizing Provider  albuterol (PROAIR HFA) 108 (90 Base) MCG/ACT inhaler INHALE 2 INHALATIONS INTO THE LUNGS EVERY 6 HOURS AS NEEDED 05/16/17  Yes [provider]  apixaban  (  ELIQUIS ) 5 MG TABS tablet Take 2.5 mg by mouth 2 (two) times daily. 01/02/18  Yes [provider]  atorvastatin  (LIPITOR) 20 MG tablet TAKE 1 TABLET BY MOUTH ONCE DAILY 08/03/17  Yes [provider]  cetirizine  (ZYRTEC ) 5 MG tablet Take 1 tablet (5 mg total) by mouth daily. 06/29/22  Yes Bernardino Ditch, NP  CYANOCOBALAMIN IJ Inject 1,000 mcg as directed every 30 (thirty) days.   Yes [provider]  diltiazem  (TIAZAC )  360 MG 24 hr capsule  02/22/20  Yes [provider]  esomeprazole (NEXIUM) 40 MG capsule Take 40 mg by mouth daily at 12 noon.   Yes [provider]  flecainide  (TAMBOCOR ) 50 MG tablet Take 50 mg by mouth 2 (two) times daily. 06/01/21  Yes [provider]  fluticasone (FLONASE) 50 MCG/ACT nasal spray Place 2 sprays into both nostrils daily.   Yes [provider]  furosemide  (LASIX ) 20 MG tablet Take 20 mg by mouth daily as needed for edema. 08/02/24 08/02/25 Yes [provider]  gabapentin  (NEURONTIN ) 300 MG capsule Take 300 mg by mouth at bedtime.   Yes [provider]  metoprolol  succinate (TOPROL -XL) 50 MG 24 hr tablet Take 50 mg by mouth daily. 02/21/18  Yes [provider]  nitroGLYCERIN  (NITROSTAT ) 0.4 MG SL tablet DIS 1 T UNDER THE TONGUE Q 5 MINUTES PRF CHEST PAIN. MY TAKE UP TO 3 DOSES 08/21/18  Yes [provider]  oxybutynin  (DITROPAN -XL) 5 MG 24 hr tablet Take 5 mg by mouth at bedtime.   Yes [provider]  diltiazem  (CARDIZEM  CD) 240 MG 24 hr capsule Take by mouth. Patient not taking: Reported on 11/14/2024 09/28/17   [provider]  doxycycline  (VIBRAMYCIN ) 100 MG capsule Take 1 capsule (100 mg total) by mouth 2 (two) times daily. Patient not taking: Reported on 11/14/2024 12/06/18   Cook, Jayce G, DO  fluorouracil (EFUDEX) 5 % cream Apply 1 application topically 2 (two) times daily. Patient not taking: Reported on 11/14/2024    [provider]  FLUoxetine (PROZAC) 10 MG capsule Take 20 mg by mouth daily.  Patient not taking: Reported on 11/14/2024    [provider]  hydrocortisone  (ANUSOL -HC) 2.5 % rectal cream APPLY TOPICALLY TO THE AFFECTED AREA THREE TIMES DAILY FOR 10 DAYS Patient not taking: Reported on 11/14/2024 04/23/20   Unk Corinn Skiff, MD  hydrOXYzine  (ATARAX ) 25 MG tablet Take 1 tablet (25 mg total) by mouth every 8 (eight) hours as needed for itching. Patient not taking:  Reported on 11/14/2024 10/29/23   Cook, Jayce G, DO  lidocaine  (LIDODERM ) 5 % Place 1 patch onto the skin daily. Patient not taking: Reported on 11/14/2024 12/03/16   Brain Redell RAMAN, MD  methylPREDNISolone  (MEDROL  DOSEPAK) 4 MG TBPK tablet Take according to the package insert. Patient not taking: Reported on 11/14/2024 06/29/22   Bernardino Ditch, NP  spironolactone (ALDACTONE) 25 MG tablet TAKE 1 TABLET(25 MG) BY MOUTH EVERY DAY Patient not taking: Reported on 11/14/2024 05/22/15   [provider]  traMADol  (ULTRAM ) 50 MG tablet Take 1 tablet (50 mg total) by mouth every 8 (eight) hours as needed. Patient not taking: Reported on 11/14/2024 11/28/16   Cook, Jayce G, DO  valACYclovir (VALTREX) 1000 MG tablet Take 1,000 mg by mouth as needed. Patient not taking: Reported on 11/14/2024    [provider]      VITAL SIGNS:  Blood pressure 125/63, pulse (!) 52, temperature 97.9 F (36.6 C), temperature source  Oral, resp. rate 15, last menstrual period 04/09/1975, SpO2 97%.  PHYSICAL EXAMINATION:  Physical Exam  GENERAL:  89 y.o.-year-old patient lying in the bed with no acute distress.  EYES: Pupils equal, round, reactive to light and accommodation. No scleral icterus. Extraocular muscles intact.  HEENT: Head atraumatic, normocephalic. Oropharynx and nasopharynx clear.  NECK:  Supple, no jugular venous distention. No thyroid enlargement, no tenderness.  LUNGS: Normal breath sounds bilaterally, no wheezing, rales,rhonchi or crepitation. No use of accessory muscles of respiration.  CARDIOVASCULAR: Irregularly irregular rate and rhythm, S1, S2 normal. No murmurs, rubs, or gallops.  ABDOMEN: Soft, nondistended, nontender. Bowel sounds present. No organomegaly or mass.  EXTREMITIES: No pedal edema, cyanosis, or clubbing.  NEUROLOGIC: Cranial nerves II through XII are intact. Muscle strength 5/5 in all extremities. Sensation intact. Gait not checked.  PSYCHIATRIC: The patient is alert and oriented  x 3.  Normal affect and good eye contact. SKIN: No obvious rash, lesion, or ulcer.   LABORATORY PANEL:   CBC Recent Labs  Lab 11/15/24 0408  WBC 4.8  HGB 10.5*  HCT 30.9*  PLT 187   ------------------------------------------------------------------------------------------------------------------  Chemistries  Recent Labs  Lab 11/14/24 1915 11/15/24 0408  NA 138 142  K 4.2 4.3  CL 103 110  CO2 19* 24  GLUCOSE 104* 93  BUN 16 14  CREATININE 1.28* 1.13*  CALCIUM  9.1 8.8*  AST 31  --   ALT 18  --   ALKPHOS 82  --   BILITOT 0.4  --    ------------------------------------------------------------------------------------------------------------------  Cardiac Enzymes No results for input(s): TROPONINI in the last 168 hours. ------------------------------------------------------------------------------------------------------------------  RADIOLOGY:  DG Chest Port 1 View Result Date: 11/14/2024 EXAM: 1 VIEW(S) XRAY OF THE CHEST 11/14/2024 07:30:00 PM COMPARISON: None available. CLINICAL HISTORY: Shortness of breath. FINDINGS: LUNGS AND PLEURA: Patchy atelectasis at left lung base. No pleural effusion. No pneumothorax. HEART AND MEDIASTINUM: Aortic atherosclerosis. No acute abnormality of the cardiac and mediastinal silhouettes. BONES AND SOFT TISSUES: Left shoulder degenerative joint disease. IMPRESSION: 1. Patchy atelectasis at the left lung base. Electronically signed by: Elsie Gravely MD 11/14/2024 07:35 PM EST RP Workstation: [id number]      IMPRESSION AND PLAN:  Assessment and Plan: * Paroxysmal atrial fibrillation with RVR (HCC) - This has been controlled after a bolus of IV Cardizem  here. - The patient be admitted to a telemetry bed. - Will continue Eliquis  and flecainide , and Toprol -XL as well as Cardizem  CD while holding for bradycardia. - This could be related to #2.  Sepsis due to pneumonia [facility]) - The patient will be placed on hydration with IV lactated  ringer. - Will continue antibiotic therapy with IV Rocephin  and Zithromax . - Mucolytic therapy be provided as well as duo nebs q.i.d. and q.4 hours p.r.n. - We will follow blood cultures.   Depression - Continue Prozac.  Dyslipidemia - We will continue statin therapy.  GERD without esophagitis - Continue PPI therapy.     DVT prophylaxis: Lovenox.  Advanced Care Planning:  Code Status: The patient is DNR and DNI.  This was discussed with her. Family Communication:  The plan of care was discussed in details with the patient (and family). I answered all questions. The patient agreed to proceed with the above mentioned plan. Further management will depend upon hospital course. Disposition Plan: Back to previous home environment Consults called: none.  All the records are reviewed and case discussed with ED provider.  Status is: Inpatient    At the time of the  admission, it appears that the appropriate admission status for this patient is inpatient.  This is judged to be reasonable and necessary in order to provide the required intensity of service to ensure the patient's safety given the presenting symptoms, physical exam findings and initial radiographic and laboratory data in the context of comorbid conditions.  The patient requires inpatient status due to high intensity of service, high risk of further deterioration and high frequency of surveillance required.  I certify that at the time of admission, it is my clinical judgment that the patient will require inpatient hospital care extending more than 2 midnights.                            Dispo: The patient is from: Home              Anticipated d/c is to: Home              Patient currently is not medically stable to d/c.              Difficult to place patient: No  Madison DELENA Peaches M.D on 11/15/2024 at 5:43 AM  [facility]   From 7 PM-7 AM, contact night-coverage [email or web address]  CC: Primary care physician; Jeffie Cheryl BRAVO, MD     [1]  Allergies Allergen Reactions   Codeine Hives   Hydrochlorothiazide Other (See Comments)    hypokalemia   "

## 2024-11-14 NOTE — ED Notes (Signed)
 1 set of blood cultures collected from [facility], labeled, and sent to lab

## 2024-11-14 NOTE — ED Notes (Addendum)
 Unsuccessful blood draw attempt of RFA; lab called for 2 set of cultures

## 2024-11-14 NOTE — ED Triage Notes (Signed)
 Pt arrives via EMS from home reporting breathing difficulty. Pt was recently discharged from other facility a week ago for flu/PNA. Pt was found to be afib RVR  on ems arrival 180/250 bpm, 10 mg cardizem  given en route. Pt alert and oriented upon arrival. Pt states her palpitations have improved and she no longer feels like she is going to pass out. Still endorses sob. NADN.

## 2024-11-15 DIAGNOSIS — F32A Depression, unspecified: Secondary | ICD-10-CM | POA: Insufficient documentation

## 2024-11-15 DIAGNOSIS — K219 Gastro-esophageal reflux disease without esophagitis: Secondary | ICD-10-CM | POA: Insufficient documentation

## 2024-11-15 DIAGNOSIS — E785 Hyperlipidemia, unspecified: Secondary | ICD-10-CM | POA: Insufficient documentation

## 2024-11-15 LAB — CBC
HCT: 30.9 % — ABNORMAL LOW (ref 36.0–46.0)
Hemoglobin: 10.5 g/dL — ABNORMAL LOW (ref 12.0–15.0)
MCH: 31.6 pg (ref 26.0–34.0)
MCHC: 34 g/dL (ref 30.0–36.0)
MCV: 93.1 fL (ref 80.0–100.0)
Platelets: 187 K/uL (ref 150–400)
RBC: 3.32 MIL/uL — ABNORMAL LOW (ref 3.87–5.11)
RDW: 14 % (ref 11.5–15.5)
WBC: 4.8 K/uL (ref 4.0–10.5)
nRBC: 0 % (ref 0.0–0.2)

## 2024-11-15 LAB — BASIC METABOLIC PANEL WITH GFR
Anion gap: 8 (ref 5–15)
BUN: 14 mg/dL (ref 8–23)
CO2: 24 mmol/L (ref 22–32)
Calcium: 8.8 mg/dL — ABNORMAL LOW (ref 8.9–10.3)
Chloride: 110 mmol/L (ref 98–111)
Creatinine, Ser: 1.13 mg/dL — ABNORMAL HIGH (ref 0.44–1.00)
GFR, Estimated: 47 mL/min — ABNORMAL LOW
Glucose, Bld: 93 mg/dL (ref 70–99)
Potassium: 4.3 mmol/L (ref 3.5–5.1)
Sodium: 142 mmol/L (ref 135–145)

## 2024-11-15 LAB — TROPONIN T, HIGH SENSITIVITY: Troponin T High Sensitivity: 25 ng/L — ABNORMAL HIGH (ref 0–19)

## 2024-11-15 LAB — PROCALCITONIN: Procalcitonin: <0.1 ng/mL

## 2024-11-15 LAB — PROTIME-INR
INR: 1.5 — ABNORMAL HIGH (ref 0.8–1.2)
Prothrombin Time: 18.9 s — ABNORMAL HIGH (ref 11.4–15.2)

## 2024-11-15 LAB — CORTISOL-AM, BLOOD: Cortisol - AM: 5 ug/dL — ABNORMAL LOW (ref 6.7–22.6)

## 2024-11-15 MED ORDER — FLECAINIDE ACETATE 50 MG PO TABS
75.0000 mg | ORAL_TABLET | Freq: Two times a day (BID) | ORAL | Status: DC
  Administered 2024-11-15 – 2024-11-16 (×2): 75 mg via ORAL
  Filled 2024-11-15 (×2): qty 2

## 2024-11-15 MED ORDER — AZITHROMYCIN 500 MG PO TABS: 500.0000 mg | ORAL_TABLET | Freq: Every day | ORAL | Status: DC

## 2024-11-15 MED ORDER — METOPROLOL SUCCINATE ER 50 MG PO TB24
25.0000 mg | ORAL_TABLET | Freq: Every day | ORAL | Status: DC
  Administered 2024-11-16: 25 mg via ORAL
  Filled 2024-11-15: qty 1

## 2024-11-15 NOTE — Assessment & Plan Note (Signed)
 Continue PPI therapy.

## 2024-11-15 NOTE — Progress Notes (Signed)
 " Progress Note   Patient: Cynthia Conley FMW:[id number] DOB: [birth year] DOA: 11/14/2024     1 DOS: the patient was seen and examined on 11/15/2024   Brief hospital course: Per H&P HPI:   Cynthia Conley is a 89 y.o female with medical history significant for GERD, hypertension, dyslipidemia, IBS, peripheral vascular disease and paroxysmal atrial fibrillation, who presented to the emergency room with acute onset of palpitations with associated dyspnea and presyncope with lightheadedness and weakness without chest pain.  The patient has been having cough productive of yellow sputum initially and later on was unable to expectorate.  She admits to associated dyspnea without wheezing.  She has been having chills.  No nausea or vomiting.  She has been having diarrhea.  No constipation or melena or bright red bleeding per rectum.  No dysuria, oliguria or hematuria or flank pain.  No bleeding diathesis.  She is found to have A-fib with RVR by EMS and was given 10 mg of IV Cardizem .   ED Course: When the patient came to the ER, BP was 150/52 with temperature 97.5 heart rate 115.  Labs revealed a CO2 19 and creatinine 1.28 with a proBNP of 470, lactic acid of 2.9 and later 1.9 high-sensitivity troponin T is 20 and later 33.  CBC showed no abnormalities.  UA was unremarkable.  Blood cultures were drawn. EKG as reviewed by me :  EKG showed atrial fibrillation with RVR 126 with PVCs and prolonged QT interval with QTc 429 MS. Imaging: Portable chest x-ray showed patchy atelectasis in the left lung base.   The patient was given 5 mg of IV Cardizem  here, IV cefepime  and vancomycin  as well as 1 L bolus of IV lactated Ringer  and will be admitted to a telemetry bed for further evaluation and management.   Assessment and Plan: * Paroxysmal atrial fibrillation now with RVR (HCC) Resolved  Unclear what precipitated. Patient now in sinus bradycardia s/p IV cardizem .  Cardiology was consulted for recommendations  regarding meds given frequent palpitations.  . Continue eliquis , may need   Patient without sepsis No sirs criteria met except tachycardia Likely not pna. Recently treated at OSH for pna. Cxr with atelectasis. Procalcitonin negative.  Will hold on further antibiotics.   Chronic nonischemic myocardial injury In setting of CKD Continue to monitor   Depression - Continue Prozac.  Dyslipidemia -  continue statin therapy.  GERD without esophagitis - Continue PPI therapy.  CKD 3 Unclear baseline.  Will continue to monitor.       Subjective: Continues to have mild chest discomfort, now in sinus bradycardia.   Physical Exam: Vitals:   11/15/24 1500 11/15/24 1530 11/15/24 1555 11/15/24 1557  BP: (!) 108/57 (!) 118/51    Pulse: (!) 42 (!) 46    Resp: 17 (!) 23    Temp:    98.7 F (37.1 C)  TempSrc:    Oral  SpO2: 99% 98% 98%    Physical Exam  Constitutional: In no distress.  Cardiovascular: Bradycardic rate, regular rhythm. No lower extremity edema  Pulmonary: Non labored breathing on room air, no wheezing or rales.   Abdominal: Soft. Non distended and non tender Musculoskeletal: Normal range of motion.     Neurological: Alert and oriented to person, place, and time. Non focal  Skin: Skin is warm and dry.   Data Reviewed:     Latest Ref Rng & Units 11/15/2024    4:08 AM 11/14/2024    7:15 PM 10/29/2023  1:16 PM  BMP  Glucose 70 - 99 mg/dL 93  895  877   BUN 8 - 23 mg/dL 14  16  20    Creatinine 0.44 - 1.00 mg/dL 8.86  8.71  8.52   Sodium 135 - 145 mmol/L 142  138  138   Potassium 3.5 - 5.1 mmol/L 4.3  4.2  4.1   Chloride 98 - 111 mmol/L 110  103  109   CO2 22 - 32 mmol/L 24  19  22    Calcium  8.9 - 10.3 mg/dL 8.8  9.1  9.2       Latest Ref Rng & Units 11/15/2024    4:08 AM 11/14/2024    7:15 PM 10/29/2023    1:16 PM  CBC  WBC 4.0 - 10.5 K/uL 4.8  6.3  8.8   Hemoglobin 12.0 - 15.0 g/dL 89.4  87.6  87.2   Hematocrit 36.0 - 46.0 % 30.9  36.4  38.0   Platelets  150 - 400 K/uL 187  256  227      Family Communication: daughter at bedside   Disposition: Status is: Inpatient Remains inpatient appropriate because: Further monitoring of heart rhythm  Planned Discharge Destination: Home    Time spent: 35 minutes  Author: Alban Pepper, MD 11/15/2024 5:27 PM  For on call review [email or web address].  "

## 2024-11-15 NOTE — Assessment & Plan Note (Signed)
-   The patient will be placed on hydration with IV lactated ringer . - Will continue antibiotic therapy with IV Rocephin  and Zithromax . - Mucolytic therapy be provided as well as duo nebs q.i.d. and q.4 hours p.r.n. - We will follow blood cultures.

## 2024-11-15 NOTE — Progress Notes (Signed)
 PHARMACIST - PHYSICIAN COMMUNICATION DR:   Franchot CONCERNING: Antibiotic IV to Oral Route Change Policy  RECOMMENDATION: This patient is receiving Azithromycin  by the intravenous route.  Based on criteria approved by the Pharmacy and Therapeutics Committee, the antibiotic(s) is/are being converted to the equivalent oral dose form(s).   DESCRIPTION: These criteria include: Patient being treated for a respiratory tract infection, urinary tract infection, cellulitis or clostridium difficile associated diarrhea if on metronidazole The patient is not neutropenic and does not exhibit a GI malabsorption state The patient is eating (either orally or via tube) and/or has been taking other orally administered medications for a least 24 hours The patient is improving clinically and has a Tmax < 100.5  If you have questions about this conversion, please contact the Pharmacy Department  []   [phone] )  Cynthia Conley [x]   [phone] )  Cynthia Conley []   [phone] )  Cynthia Conley []   [phone] )  Cynthia Conley []   [phone] )  Cynthia Conley   Olam Fritter, PharmD, BCPS 11/15/2024 9:44 AM

## 2024-11-15 NOTE — Consult Note (Signed)
 " CARDIOLOGY CONSULT NOTE               Patient ID: Cynthia Conley MRN: [id number] DOB/AGE: [birth year] 87 y.o.  Admit date: 11/14/2024 Referring Physician Dr. Norleen Hotter hospitalist Primary Physician Dr. Cheryl Jericho primary Primary Cardiologist [facility] Reason for Consultation shortness of breath palpitation  HPI: 89 year old female presented with palpitations tachycardia GERD hypertension hyperlipidemia peripheral vascular disease found to be in paroxysmal atrial fibrillation had been on flecainide  Cardizem  Eliquis  in the past but rate elevated to 120 so came to the emergency room she was also found to have elevated BNP elevated lactic acid chest x-ray concerning for possible pneumonia patient was treated with IV Cardizem  fluid resuscitation kept on flecainide  and transition from heparin to Eliquis .  Denied any chest pain no bronchospasm or syncope  Review of systems complete and found to be negative unless listed above     Past Medical History:  Diagnosis Date   Dysrhythmia    GERD (gastroesophageal reflux disease)    Hyperlipemia    Hypertension    IBS (irritable bowel syndrome) 06/09/2018   Peripheral vascular disease     Past Surgical History:  Procedure Laterality Date   ABDOMINAL HYSTERECTOMY     CHOLECYSTECTOMY     COLONOSCOPY     COLONOSCOPY WITH PROPOFOL  N/A 09/14/2018   Procedure: COLONOSCOPY WITH PROPOFOL ;  Surgeon: Unk Corinn Skiff, MD;  Location: [facility] ENDOSCOPY;  Service: Gastroenterology;  Laterality: N/A;    (Not in a hospital admission)  Social History   Socioeconomic History   Marital status: Widowed    Spouse name: Not on file   Number of children: Not on file   Years of education: Not on file   Highest education level: Not on file  Occupational History   Not on file  Tobacco Use   Smoking status: Never   Smokeless tobacco: Never  Vaping Use   Vaping status: Never Used  Substance and Sexual Activity   Alcohol use: No   Drug use: Never    Sexual activity: Not on file  Other Topics Concern   Not on file  Social History Narrative   Not on file   Social Drivers of Health   Tobacco Use: Low Risk (11/14/2024)   Patient History    Smoking Tobacco Use: Never    Smokeless Tobacco Use: Never    Passive Exposure: Not on file  Financial Resource Strain: Low Risk  (04/19/2024)   Received from [facility]   Overall Financial Resource Strain (CARDIA)    Difficulty of Paying Living Expenses: Not hard at all  Food Insecurity: No Food Insecurity (04/19/2024)   Received from [facility]   Epic    Within the past 12 months, you worried that your food would run out before you got the money to buy more.: Never true    Within the past 12 months, the food you bought just didn't last and you didn't have money to get more.: Never true  Transportation Needs: No Transportation Needs (04/19/2024)   Received from [facility] - Transportation    In the past 12 months, has lack of transportation kept you from medical appointments or from getting medications?: No    Lack of Transportation (Non-Medical): No  Physical Activity: Not on file  Stress: Not on file  Social Connections: Not on file  Intimate Partner Violence: Not on file  Depression (EYV7-0): Not on file  Alcohol Screen: Not on  file  Housing: Low Risk  (08/02/2024)   Received from [facility]   Epic    In the last 12 months, was there a time when you were not able to pay the mortgage or rent on time?: No    In the past 12 months, how many times have you moved where you were living?: 0    At any time in the past 12 months, were you homeless or living in a shelter (including now)?: No  Utilities: Not At Risk (04/19/2024)   Received from [facility]   Epic    In the past 12 months has the electric, gas, oil, or water company threatened to shut off services in your home?: No  Health  Literacy: Not on file    History reviewed. No pertinent family history.    Review of systems complete and found to be negative unless listed above      PHYSICAL EXAM  General: Well developed, well nourished, in no acute distress HEENT:  Normocephalic and atramatic Neck:  No JVD.  Lungs: Clear bilaterally to auscultation and percussion. Heart: HRRR . Normal S1 and S2 without gallops or murmurs.  Abdomen: Bowel sounds are positive, abdomen soft and non-tender  Msk:  Back normal, normal gait. Normal strength and tone for age. Extremities: No clubbing, cyanosis or edema.   Neuro: Alert and oriented X 3. Psych:  Good affect, responds appropriately  Labs:   Lab Results  Component Value Date   WBC 4.8 11/15/2024   HGB 10.5 (L) 11/15/2024   HCT 30.9 (L) 11/15/2024   MCV 93.1 11/15/2024   PLT 187 11/15/2024    Recent Labs  Lab 11/14/24 1915 11/15/24 0408  NA 138 142  K 4.2 4.3  CL 103 110  CO2 19* 24  BUN 16 14  CREATININE 1.28* 1.13*  CALCIUM  9.1 8.8*  PROT 6.5  --   BILITOT 0.4  --   ALKPHOS 82  --   ALT 18  --   AST 31  --   GLUCOSE 104* 93   Lab Results  Component Value Date   TROPONINI < 0.02 05/06/2014   No results found for: CHOL No results found for: HDL No results found for: LDLCALC No results found for: TRIG No results found for: CHOLHDL No results found for: LDLDIRECT    Radiology: [facility] Chest Port 1 View Result Date: 11/14/2024 EXAM: 1 VIEW(S) XRAY OF THE CHEST 11/14/2024 07:30:00 PM COMPARISON: None available. CLINICAL HISTORY: Shortness of breath. FINDINGS: LUNGS AND PLEURA: Patchy atelectasis at left lung base. No pleural effusion. No pneumothorax. HEART AND MEDIASTINUM: Aortic atherosclerosis. No acute abnormality of the cardiac and mediastinal silhouettes. BONES AND SOFT TISSUES: Left shoulder degenerative joint disease. IMPRESSION: 1. Patchy atelectasis at the left lung base. Electronically signed by: Elsie Gravely MD 11/14/2024 07:35  PM EST RP Workstation: [id number]    EKG: Rapid atrial fibrillation which converted to sinus rhythm at around 50  ASSESSMENT AND PLAN:  Paroxysmal atrial fibrillation RVR Mild anemia Bradycardia Mild obesity Chronic renal sufficiency . Plan Agree with admit to telemetry follow-up troponins EKGs echocardiogram Recommend increase flecainide  to 75 mg twice a day Continue Eliquis  2.5 twice a day to help with anticoagulation.  Prevention Cardizem  to help with rate control and hypertension Recommend nephrology input for renal insufficiency Echocardiogram to help with left ventricular function wall motion valvular structures Consider referral to EP for consideration of possible ablation  Signed: Cara JONETTA Lovelace MD,  11/15/2024, 4:54  PM      "

## 2024-11-15 NOTE — Assessment & Plan Note (Signed)
 -  We will continue statin therapy.

## 2024-11-15 NOTE — Assessment & Plan Note (Addendum)
-   This has been controlled after a bolus of IV Cardizem  here. - The patient be admitted to a telemetry bed. - Will continue Eliquis  and flecainide , and Toprol -XL as well as Cardizem  CD while holding for bradycardia. - This could be related to #2.

## 2024-11-15 NOTE — Assessment & Plan Note (Signed)
 Continue Prozac

## 2024-11-15 NOTE — Hospital Course (Addendum)
 Cynthia Conley is a 89 y.o. female with a history of paroxysmal atrial fibrillation on reduced dose eliquis , mod MR/TR, hypertension, hyperlipidemia, diabetes, stage III CKD, GERD, and anxiety who presented with acute onset of presyncope, palpitations and shortness of breath. She was found to be in afib with RVR. She received a dose of cardizem  and converted to sinus rhythm.

## 2024-11-16 ENCOUNTER — Other Ambulatory Visit: Payer: Self-pay

## 2024-11-16 LAB — BASIC METABOLIC PANEL WITH GFR
Anion gap: 9 (ref 5–15)
BUN: 9 mg/dL (ref 8–23)
CO2: 24 mmol/L (ref 22–32)
Calcium: 9.3 mg/dL (ref 8.9–10.3)
Chloride: 108 mmol/L (ref 98–111)
Creatinine, Ser: 1.17 mg/dL — ABNORMAL HIGH (ref 0.44–1.00)
GFR, Estimated: 45 mL/min — ABNORMAL LOW
Glucose, Bld: 106 mg/dL — ABNORMAL HIGH (ref 70–99)
Potassium: 4.2 mmol/L (ref 3.5–5.1)
Sodium: 141 mmol/L (ref 135–145)

## 2024-11-16 LAB — CBC
HCT: 36.4 % (ref 36.0–46.0)
Hemoglobin: 12.1 g/dL (ref 12.0–15.0)
MCH: 31 pg (ref 26.0–34.0)
MCHC: 33.2 g/dL (ref 30.0–36.0)
MCV: 93.3 fL (ref 80.0–100.0)
Platelets: 214 K/uL (ref 150–400)
RBC: 3.9 MIL/uL (ref 3.87–5.11)
RDW: 14.2 % (ref 11.5–15.5)
WBC: 7 K/uL (ref 4.0–10.5)
nRBC: 0 % (ref 0.0–0.2)

## 2024-11-16 LAB — MAGNESIUM: Magnesium: 2 mg/dL (ref 1.7–2.4)

## 2024-11-16 LAB — D-DIMER, QUANTITATIVE: D-Dimer, Quant: 0.45 ug{FEU}/mL (ref 0.00–0.50)

## 2024-11-16 MED ORDER — METOPROLOL SUCCINATE ER 25 MG PO TB24
25.0000 mg | ORAL_TABLET | Freq: Every day | ORAL | 0 refills | Status: DC
  Filled 2024-11-16: qty 30, 30d supply, fill #0

## 2024-11-16 MED ORDER — FLECAINIDE ACETATE 50 MG PO TABS
75.0000 mg | ORAL_TABLET | Freq: Two times a day (BID) | ORAL | 0 refills | Status: DC
  Filled 2024-11-16: qty 90, 30d supply, fill #0

## 2024-11-16 NOTE — Discharge Instructions (Addendum)
 Please follow up with your primary care physician in 1-2 weeks as well. You will need to have your kidney function re-checked with a lab.   Please follow up with your cardiologist in ~ 1 week as well.

## 2024-11-16 NOTE — Progress Notes (Signed)
 PT Cancellation Note  Patient Details Name: Cynthia Conley MRN: [id number] DOB: 09-Apr-1935   Cancelled Treatment:    Reason Eval/Treat Not Completed: PT screened, no needs identified, will sign off Orders received, chart reviewed. Per RN, patient has discharge orders and has been ambulating independently to/from bathroom. No skilled PT needs warranted. PT will sign off.   Maryanne Finder, PT, DPT Physical Therapist - [place]  [facility]   Kehlani Vancamp A Karia Ehresman 11/16/2024, 11:45 AM

## 2024-11-16 NOTE — Discharge Summary (Addendum)
 " Physician Discharge Summary   Patient: Cynthia Conley MRN: [id number] DOB: 19-Aug-1935  Admit date:     11/14/2024  Discharge date: 11/16/2024  Discharge Physician: Alban Pepper   PCP: Jeffie Cheryl BRAVO, MD   Recommendations at discharge:   Repeat BMP to ensure serum creatinine stable  Discharge Diagnoses: Principal Problem:   Paroxysmal atrial fibrillation with RVR (HCC) Active Problems:   GERD without esophagitis   Dyslipidemia   Depression  Resolved Problems:   * No resolved hospital problems. [facility] Course:  Per H&P HPI  Cynthia Conley is a 89 y.o. with medical history significant for GERD, hypertension, dyslipidemia, IBS, peripheral vascular disease and paroxysmal atrial fibrillation, who presented to the emergency room with acute onset of palpitations with associated dyspnea and presyncope with lightheadedness and weakness without chest pain.  The patient has been having cough productive of yellow sputum initially and later on was unable to expectorate.  She admits to associated dyspnea without wheezing.  She has been having chills.  No nausea or vomiting.  She has been having diarrhea.  No constipation or melena or bright red bleeding per rectum.  No dysuria, oliguria or hematuria or flank pain.  No bleeding diathesis.  She is found to have A-fib with RVR by EMS and was given 10 mg of IV Cardizem .   ED Course: When the patient came to the ER, BP was 150/52 with temperature 97.5 heart rate 115.  Labs revealed a CO2 19 and creatinine 1.28 with a proBNP of 470, lactic acid of 2.9 and later 1.9 high-sensitivity troponin T is 20 and later 33.  CBC showed no abnormalities.  UA was unremarkable.  Blood cultures were drawn. EKG as reviewed by me :  EKG showed atrial fibrillation with RVR 126 with PVCs and prolonged QT interval with QTc 429 MS. Imaging: Portable chest x-ray showed patchy atelectasis in the left lung base.   The patient was given 5 mg of IV Cardizem  here, IV  cefepime  and vancomycin  as well as 1 L bolus of IV lactated Ringer  and will be admitted to a telemetry bed for further evaluation and management.  Assessment and Plan: Paroxysmal atrial fibrillation now with RVR (HCC) Resolved  Unclear what precipitated. Patient in sinus bradycardia after IV Cardizem .  Cardiology was consulted for medication recommendations.  Patient's p.o. Cardizem  was stopped.  Her metoprolol  was decreased and her flecainide  was increased.  She remained in normal sinus rhythm and sinus bradycardia. TTE 03/2022 with nl LVEF 55%, mod MR  She will follow-up with cardiology outpatient.  She will continue Eliquis  at a reduced dose of 2-1/2 mg twice daily  Acute  nonischemic myocardial injury  In the setting of CKD, and her tachyarrhythmia. No further cardiac workup was needed.   Depression Prozac was continued  HLD Statin was continued  GERD without esophagitis  Continued on PPI therapy   CKD3b  Noted in nephrology notes.     Latest Ref Rng & Units 11/16/2024    8:21 AM 11/15/2024    4:08 AM 11/14/2024    7:15 PM  BMP  Glucose 70 - 99 mg/dL 893  93  895   BUN 8 - 23 mg/dL 9  14  16    Creatinine 0.44 - 1.00 mg/dL 8.82  8.86  8.71   Sodium 135 - 145 mmol/L 141  142  138   Potassium 3.5 - 5.1 mmol/L 4.2  4.3  4.2   Chloride 98 - 111 mmol/L 108  110  103   CO2 22 - 32 mmol/L 24  24  19    Calcium  8.9 - 10.3 mg/dL 9.3  8.8  9.1    Serum creatinine improved here. Will continue to monitor outpatient.        Consultants: cardiology  Procedures performed: None  Disposition: Home Diet recommendation:  Regular diet DISCHARGE MEDICATION: Allergies as of 11/16/2024       Reactions   Codeine Hives   Hydrochlorothiazide Other (See Comments)   hypokalemia        Medication List     PAUSE taking these medications    fluorouracil 5 % cream Wait to take this until your doctor or other care provider tells you to start again. Commonly known as: EFUDEX Apply 1  application topically 2 (two) times daily.   hydrocortisone  2.5 % rectal cream Wait to take this until your doctor or other care provider tells you to start again. Commonly known as: ANUSOL -HC APPLY TOPICALLY TO THE AFFECTED AREA THREE TIMES DAILY FOR 10 DAYS   lidocaine  5 % Wait to take this until your doctor or other care provider tells you to start again. Commonly known as: Lidoderm  Place 1 patch onto the skin daily.       STOP taking these medications    diltiazem  240 MG 24 hr capsule Commonly known as: CARDIZEM  CD   diltiazem  360 MG 24 hr capsule Commonly known as: TIAZAC    doxycycline  100 MG capsule Commonly known as: VIBRAMYCIN    methylPREDNISolone  4 MG Tbpk tablet Commonly known as: MEDROL  DOSEPAK   spironolactone 25 MG tablet Commonly known as: ALDACTONE   traMADol  50 MG tablet Commonly known as: ULTRAM    valACYclovir 1000 MG tablet Commonly known as: VALTREX       TAKE these medications    atorvastatin  20 MG tablet Commonly known as: LIPITOR TAKE 1 TABLET BY MOUTH ONCE DAILY   cetirizine  5 MG tablet Commonly known as: ZYRTEC  Take 1 tablet (5 mg total) by mouth daily.   CYANOCOBALAMIN IJ Inject 1,000 mcg as directed every 30 (thirty) days.   Eliquis  5 MG Tabs tablet Generic drug: apixaban  Take 2.5 mg by mouth 2 (two) times daily.   esomeprazole 40 MG capsule Commonly known as: NEXIUM Take 40 mg by mouth daily at 12 noon.   flecainide  50 MG tablet Commonly known as: TAMBOCOR  Take 1.5 tablets (75 mg total) by mouth 2 (two) times daily. What changed: how much to take   FLUoxetine 10 MG capsule Commonly known as: PROZAC Take 20 mg by mouth daily.   fluticasone 50 MCG/ACT nasal spray Commonly known as: FLONASE Place 2 sprays into both nostrils daily.   furosemide  20 MG tablet Commonly known as: LASIX  Take 20 mg by mouth daily as needed for edema.   gabapentin  300 MG capsule Commonly known as: NEURONTIN  Take 300 mg by mouth at  bedtime.   hydrOXYzine  25 MG tablet Commonly known as: ATARAX  Take 1 tablet (25 mg total) by mouth every 8 (eight) hours as needed for itching.   metoprolol  succinate 25 MG 24 hr tablet Commonly known as: TOPROL -XL Take 1 tablet (25 mg total) by mouth daily. Take with or immediately following a meal. Start taking on: November 17, 2024 What changed:  medication strength how much to take additional instructions   nitroGLYCERIN  0.4 MG SL tablet Commonly known as: NITROSTAT  DIS 1 T UNDER THE TONGUE Q 5 MINUTES PRF CHEST PAIN. MY TAKE UP TO 3 DOSES   oxybutynin  5  MG 24 hr tablet Commonly known as: DITROPAN -XL Take 5 mg by mouth at bedtime.   ProAir HFA 108 (90 Base) MCG/ACT inhaler Generic drug: albuterol INHALE 2 INHALATIONS INTO THE LUNGS EVERY 6 HOURS AS NEEDED        Follow-up Information     Callwood, Cara D, MD. Go in 1 week(s).   Specialties: Cardiology, Internal Medicine Contact information: [address] [place] [address] [phone]         Jeffie Cheryl BRAVO, MD. Schedule an appointment as soon as possible for a visit.   Specialty: Family Medicine Contact information: [address] [facility] - PRIMARY CARE [place] [address] [phone]                Discharge Exam:    11/16/2024   12:00 PM 11/16/2024    9:12 AM 11/16/2024    4:30 AM  Vitals with BMI  Systolic 116 112 861  Diastolic 70 61 66  Pulse 62 58 59    Physical Exam  Constitutional: In no distress.  Cardiovascular: Bradycardic rate, regular rhythm. No lower extremity edema  Pulmonary: Non labored breathing on room air, no wheezing or rales.   Abdominal: Soft. Non distended and non tender Musculoskeletal: Normal range of motion.     Neurological: Alert and oriented to person, place, and time. Non focal  Skin: Skin is warm and dry.    Condition at discharge: fair  The results of significant diagnostics from this hospitalization (including imaging,  microbiology, ancillary and laboratory) are listed below for reference.   Imaging Studies: DG Chest Port 1 View Result Date: 11/14/2024 EXAM: 1 VIEW(S) XRAY OF THE CHEST 11/14/2024 07:30:00 PM COMPARISON: None available. CLINICAL HISTORY: Shortness of breath. FINDINGS: LUNGS AND PLEURA: Patchy atelectasis at left lung base. No pleural effusion. No pneumothorax. HEART AND MEDIASTINUM: Aortic atherosclerosis. No acute abnormality of the cardiac and mediastinal silhouettes. BONES AND SOFT TISSUES: Left shoulder degenerative joint disease. IMPRESSION: 1. Patchy atelectasis at the left lung base. Electronically signed by: Elsie Gravely MD 11/14/2024 07:35 PM EST RP Workstation: [id number]    Microbiology: Results for orders placed or performed during the hospital encounter of 11/14/24  Culture, blood (routine x 2)     Status: None (Preliminary result)   Collection Time: 11/14/24  9:19 PM   Specimen: BLOOD  Result Value Ref Range Status   Specimen Description BLOOD LEFT ANTECUBITAL  Final   Special Requests   Final    BOTTLES DRAWN AEROBIC AND ANAEROBIC Blood Culture adequate volume   Culture   Final    NO GROWTH 2 DAYS Performed at [facility], [address], [place], [place] [address]    Report Status PENDING  Incomplete  Culture, blood (routine x 2)     Status: None (Preliminary result)   Collection Time: 11/14/24 10:50 PM   Specimen: BLOOD  Result Value Ref Range Status   Specimen Description BLOOD RIGHT ANTECUBITAL  Final   Special Requests   Final    BOTTLES DRAWN AEROBIC AND ANAEROBIC Blood Culture adequate volume   Culture   Final    NO GROWTH 2 DAYS Performed at [facility], [address] [place], [place] [address]    Report Status PENDING  Incomplete    Labs: CBC: Recent Labs  Lab 11/14/24 1915 11/15/24 0408 11/16/24 0821  WBC 6.3 4.8 7.0  NEUTROABS 3.3  --   --   HGB 12.3 10.5* 12.1  HCT 36.4 30.9* 36.4  MCV 92.2 93.1 93.3  PLT 256 187 214    Basic Metabolic Panel: Recent Labs  Lab 11/14/24 1915 11/15/24 0408 11/16/24 0821  NA 138 142 141  K 4.2 4.3 4.2  CL 103 110 108  CO2 19* 24 24  GLUCOSE 104* 93 106*  BUN 16 14 9   CREATININE 1.28* 1.13* 1.17*  CALCIUM  9.1 8.8* 9.3  MG  --   --  2.0   Liver Function Tests: Recent Labs  Lab 11/14/24 1915  AST 31  ALT 18  ALKPHOS 82  BILITOT 0.4  PROT 6.5  ALBUMIN 3.9   CBG: No results for input(s): GLUCAP in the last 168 hours.  Discharge time spent: greater than 30 minutes.  Signed: Alban Pepper, MD [facility] 11/16/2024 "

## 2024-11-16 NOTE — Progress Notes (Signed)
 [facility] CARDIOLOGY PROGRESS NOTE   Patient ID: Cynthia Conley MRN: [id number] DOB/AGE: Jun 14, 1935 89 y.o.  Admit date: 11/14/2024 Referring Physician Dr. Alban Pepper Primary Physician Feldpausch, Cheryl BRAVO, MD  Primary Cardiologist Dr. Florencio Reason for Consultation atrial fibrillation RVR, bradycardia  HPI: [name] is a 89 y.o. female with a past medical history of coronary artery disease valvular heart disease, paroxysmal atrial fibrillation, hypertension, hyperlipidemia, peripheral vascular disease who presented to the ED on 11/14/2024 for shortness of breath and palpitations. Cardiology was consulted for further evaluation.   Interval History:  - Patient seen and examined this morning, resting comfortably in bed with family at bedside. - States that she is feeling much better today with no chest pain, palpitations, shortness of breath. - Also denies any dizziness.  Heart rate has remained stable on telemetry.  Review of systems complete and found to be negative unless listed above   Vitals:   11/16/24 0151 11/16/24 0430 11/16/24 0912 11/16/24 1200  BP: (!) 101/55 138/66 112/61 116/70  Pulse: (!) 46 (!) 59 (!) 58 62  Resp: 20 14 14 18   Temp: 98.2 F (36.8 C) 98.3 F (36.8 C) 98.7 F (37.1 C) 99 F (37.2 C)  TempSrc: Oral  Oral Oral  SpO2: 95% 98% 100% 98%     Intake/Output Summary (Last 24 hours) at 11/16/2024 1239 Last data filed at 11/16/2024 0143 Gross per 24 hour  Intake --  Output 950 ml  Net -950 ml     PHYSICAL EXAM General: Well-appearing elderly female, well nourished, in no acute distress. HEENT: Normocephalic and atraumatic. Neck: No JVD.  Lungs: Normal respiratory effort on room air. Clear bilaterally to auscultation. No wheezes, crackles, rhonchi.  Heart: HRRR. Normal S1 and S2 without gallops or murmurs. Radial & DP pulses 2+ bilaterally. Abdomen: Non-distended appearing.  Msk: Normal strength and tone for age. Extremities: No  clubbing, cyanosis or edema.   Neuro: Alert and oriented X 3. Psych: Mood appropriate, affect congruent.    LABS: Basic Metabolic Panel: Recent Labs    11/15/24 0408 11/16/24 0821  NA 142 141  K 4.3 4.2  CL 110 108  CO2 24 24  GLUCOSE 93 106*  BUN 14 9  CREATININE 1.13* 1.17*  CALCIUM  8.8* 9.3  MG  --  2.0   Liver Function Tests: Recent Labs    11/14/24 1915  AST 31  ALT 18  ALKPHOS 82  BILITOT 0.4  PROT 6.5  ALBUMIN 3.9   No results for input(s): LIPASE, AMYLASE in the last 72 hours. CBC: Recent Labs    11/14/24 1915 11/15/24 0408 11/16/24 0821  WBC 6.3 4.8 7.0  NEUTROABS 3.3  --   --   HGB 12.3 10.5* 12.1  HCT 36.4 30.9* 36.4  MCV 92.2 93.1 93.3  PLT 256 187 214   Cardiac Enzymes: No results for input(s): CKTOTAL, CKMB, CKMBINDEX, TROPONINIHS in the last 72 hours. BNP: No results for input(s): BNP in the last 72 hours. D-Dimer: Recent Labs    11/16/24 0910  DDIMER 0.45   Hemoglobin A1C: No results for input(s): HGBA1C in the last 72 hours. Fasting Lipid Panel: No results for input(s): CHOL, HDL, LDLCALC, TRIG, CHOLHDL, LDLDIRECT in the last 72 hours. Thyroid Function Tests: No results for input(s): TSH, T4TOTAL, T3FREE, THYROIDAB in the last 72 hours.  Invalid input(s): FREET3 Anemia Panel: No results for input(s): VITAMINB12, FOLATE, FERRITIN, TIBC, IRON, RETICCTPCT in the last 72 hours.  DG Chest Port 1 View Result  Date: 11/14/2024 EXAM: 1 VIEW(S) XRAY OF THE CHEST 11/14/2024 07:30:00 PM COMPARISON: None available. CLINICAL HISTORY: Shortness of breath. FINDINGS: LUNGS AND PLEURA: Patchy atelectasis at left lung base. No pleural effusion. No pneumothorax. HEART AND MEDIASTINUM: Aortic atherosclerosis. No acute abnormality of the cardiac and mediastinal silhouettes. BONES AND SOFT TISSUES: Left shoulder degenerative joint disease. IMPRESSION: 1. Patchy atelectasis at the left lung base.  Electronically signed by: Elsie Gravely MD 11/14/2024 07:35 PM EST RP Workstation: [id number]     ECHO 03/2022: NORMAL LEFT VENTRICULAR SYSTOLIC FUNCTION  NORMAL RIGHT VENTRICULAR SYSTOLIC FUNCTION  MODERATE VALVULAR REGURGITATION (See above)  NO VALVULAR STENOSIS  ESTIMATED LVEF >55%  Aortic: TRIVIAL AI  Mitral: MODERATE MR; MAX VEL. 5.65m/s; PEAK GRADIENT:  Tricuspid: MILD TR (2.52m/s; RVSP: )  Pulmonic: TRIVIAL PI  MILDLY DILATED ASCENDING AORTA MEASURING UP TO 3.6cm   TELEMETRY (personally reviewed): Sinus rhythm 50s  EKG (personally reviewed): Atrial fibrillation RVR rate 126 bpm  DATA reviewed by me 11/16/2024: last 24h vitals tele labs imaging I/O, hospitalist progress note  Principal Problem:   Paroxysmal atrial fibrillation with RVR (HCC) Active Problems:   Sepsis due to pneumonia (HCC)   GERD without esophagitis   Dyslipidemia   Depression    ASSESSMENT AND PLAN: [name] is a 89 y.o. female with a past medical history of coronary artery disease valvular heart disease, paroxysmal atrial fibrillation, hypertension, hyperlipidemia, peripheral vascular disease who presented to the ED on 11/14/2024 for shortness of breath and palpitations. Cardiology was consulted for further evaluation.   # Paroxysmal atrial fibrillation # Atrial fibrillation RVR # Sinus bradycardia # Hypertension Patient presented with complaints of shortness of breath and palpitations, found to be in atrial fibrillation RVR with rate in the 120s.  Converted to normal sinus rhythm with push of IV diltiazem .  Sinus rates noted to be borderline slow but she is overall asymptomatic with this. - Continue increased dose of flecainide  at 75 mg twice daily. - Continue decreased dose of metoprolol  succinate at 25 mg daily. - Continue Eliquis  2.5 mg twice daily for stroke risk reduction. - Continue Lasix  20 mg as needed. - Continue Lipitor 20 mg daily.  Ok for discharge today from a  cardiac perspective. Will arrange for follow up in clinic with Dr. Florencio in 1-2 weeks.    This patient's case was discussed and created with Dr. Florencio and he is in agreement.  Signed:  Danita Bloch, PA-C  11/16/2024, 12:39 PM [facility] Cardiology

## 2024-11-19 LAB — CULTURE, BLOOD (ROUTINE X 2)
Culture: NO GROWTH
Culture: NO GROWTH
Special Requests: ADEQUATE
Special Requests: ADEQUATE

## 2024-11-25 ENCOUNTER — Emergency Department

## 2024-11-25 ENCOUNTER — Observation Stay: Admission: EM | Admit: 2024-11-25 | Discharge: 2024-11-27 | Disposition: A | Discharge status: Home Health

## 2024-11-25 ENCOUNTER — Other Ambulatory Visit: Payer: Self-pay

## 2024-11-25 DIAGNOSIS — Z79899 Other long term (current) drug therapy: Secondary | ICD-10-CM | POA: Diagnosis not present

## 2024-11-25 DIAGNOSIS — R002 Palpitations: Secondary | ICD-10-CM

## 2024-11-25 DIAGNOSIS — R2681 Unsteadiness on feet: Secondary | ICD-10-CM | POA: Diagnosis not present

## 2024-11-25 DIAGNOSIS — I4891 Unspecified atrial fibrillation: Principal | ICD-10-CM | POA: Insufficient documentation

## 2024-11-25 DIAGNOSIS — K219 Gastro-esophageal reflux disease without esophagitis: Secondary | ICD-10-CM | POA: Diagnosis not present

## 2024-11-25 DIAGNOSIS — E785 Hyperlipidemia, unspecified: Secondary | ICD-10-CM | POA: Insufficient documentation

## 2024-11-25 DIAGNOSIS — R110 Nausea: Secondary | ICD-10-CM | POA: Diagnosis not present

## 2024-11-25 DIAGNOSIS — I129 Hypertensive chronic kidney disease with stage 1 through stage 4 chronic kidney disease, or unspecified chronic kidney disease: Secondary | ICD-10-CM | POA: Diagnosis not present

## 2024-11-25 DIAGNOSIS — N3281 Overactive bladder: Secondary | ICD-10-CM | POA: Diagnosis not present

## 2024-11-25 DIAGNOSIS — R0602 Shortness of breath: Secondary | ICD-10-CM | POA: Insufficient documentation

## 2024-11-25 DIAGNOSIS — R2689 Other abnormalities of gait and mobility: Secondary | ICD-10-CM | POA: Insufficient documentation

## 2024-11-25 DIAGNOSIS — I10 Essential (primary) hypertension: Secondary | ICD-10-CM | POA: Diagnosis present

## 2024-11-25 DIAGNOSIS — G9340 Encephalopathy, unspecified: Secondary | ICD-10-CM | POA: Insufficient documentation

## 2024-11-25 DIAGNOSIS — M6281 Muscle weakness (generalized): Secondary | ICD-10-CM | POA: Diagnosis not present

## 2024-11-25 DIAGNOSIS — I2510 Atherosclerotic heart disease of native coronary artery without angina pectoris: Secondary | ICD-10-CM | POA: Insufficient documentation

## 2024-11-25 DIAGNOSIS — N1832 Chronic kidney disease, stage 3b: Secondary | ICD-10-CM | POA: Diagnosis not present

## 2024-11-25 LAB — CBC WITH DIFFERENTIAL/PLATELET
Abs Immature Granulocytes: 0.03 K/uL (ref 0.00–0.07)
Basophils Absolute: 0 K/uL (ref 0.0–0.1)
Basophils Relative: 1 %
Eosinophils Absolute: 0.3 K/uL (ref 0.0–0.5)
Eosinophils Relative: 5 %
HCT: 32 % — ABNORMAL LOW (ref 36.0–46.0)
Hemoglobin: 10.9 g/dL — ABNORMAL LOW (ref 12.0–15.0)
Immature Granulocytes: 1 %
Lymphocytes Relative: 31 %
Lymphs Abs: 1.9 K/uL (ref 0.7–4.0)
MCH: 31.2 pg (ref 26.0–34.0)
MCHC: 34.1 g/dL (ref 30.0–36.0)
MCV: 91.7 fL (ref 80.0–100.0)
Monocytes Absolute: 0.6 K/uL (ref 0.1–1.0)
Monocytes Relative: 9 %
Neutro Abs: 3.4 K/uL (ref 1.7–7.7)
Neutrophils Relative %: 53 %
Platelets: 180 K/uL (ref 150–400)
RBC: 3.49 MIL/uL — ABNORMAL LOW (ref 3.87–5.11)
RDW: 14.1 % (ref 11.5–15.5)
WBC: 6.3 K/uL (ref 4.0–10.5)
nRBC: 0 % (ref 0.0–0.2)

## 2024-11-25 LAB — TROPONIN T, HIGH SENSITIVITY
Troponin T High Sensitivity: 22 ng/L — ABNORMAL HIGH (ref 0–19)
Troponin T High Sensitivity: 25 ng/L — ABNORMAL HIGH (ref 0–19)

## 2024-11-25 LAB — PRO BRAIN NATRIURETIC PEPTIDE: Pro Brain Natriuretic Peptide: 1425 pg/mL — ABNORMAL HIGH

## 2024-11-25 LAB — COMPREHENSIVE METABOLIC PANEL WITH GFR
ALT: 19 U/L (ref 0–44)
AST: 29 U/L (ref 15–41)
Albumin: 3.6 g/dL (ref 3.5–5.0)
Alkaline Phosphatase: 87 U/L (ref 38–126)
Anion gap: 13 (ref 5–15)
BUN: 11 mg/dL (ref 8–23)
CO2: 20 mmol/L — ABNORMAL LOW (ref 22–32)
Calcium: 8.6 mg/dL — ABNORMAL LOW (ref 8.9–10.3)
Chloride: 104 mmol/L (ref 98–111)
Creatinine, Ser: 1.19 mg/dL — ABNORMAL HIGH (ref 0.44–1.00)
GFR, Estimated: 44 mL/min — ABNORMAL LOW
Glucose, Bld: 175 mg/dL — ABNORMAL HIGH (ref 70–99)
Potassium: 3.9 mmol/L (ref 3.5–5.1)
Sodium: 137 mmol/L (ref 135–145)
Total Bilirubin: 0.3 mg/dL (ref 0.0–1.2)
Total Protein: 5.8 g/dL — ABNORMAL LOW (ref 6.5–8.1)

## 2024-11-25 MED ORDER — DILTIAZEM HCL 60 MG PO TABS: 30.0000 mg | ORAL_TABLET | Freq: Four times a day (QID) | ORAL | Status: DC

## 2024-11-25 MED ORDER — OXYBUTYNIN CHLORIDE ER 5 MG PO TB24
5.0000 mg | ORAL_TABLET | Freq: Every day | ORAL | Status: DC
  Administered 2024-11-25 – 2024-11-26 (×2): 5 mg via ORAL
  Filled 2024-11-25 (×2): qty 1

## 2024-11-25 MED ORDER — DILTIAZEM HCL-DEXTROSE 125-5 MG/125ML-% IV SOLN (PREMIX)
5.0000 mg/h | INTRAVENOUS | Status: DC
  Administered 2024-11-25: 5 mg/h via INTRAVENOUS
  Filled 2024-11-25: qty 125

## 2024-11-25 MED ORDER — FLECAINIDE ACETATE 50 MG PO TABS
75.0000 mg | ORAL_TABLET | Freq: Two times a day (BID) | ORAL | Status: DC
  Filled 2024-11-25: qty 2

## 2024-11-25 MED ORDER — METOPROLOL TARTRATE 25 MG PO TABS
25.0000 mg | ORAL_TABLET | Freq: Once | ORAL | Status: AC
  Administered 2024-11-25: 25 mg via ORAL
  Filled 2024-11-25: qty 1

## 2024-11-25 MED ORDER — ACETAMINOPHEN 325 MG PO TABS: 650.0000 mg | ORAL_TABLET | ORAL | Status: DC | PRN

## 2024-11-25 MED ORDER — ALPRAZOLAM 0.25 MG PO TABS: 0.2500 mg | ORAL_TABLET | Freq: Two times a day (BID) | ORAL | Status: DC | PRN

## 2024-11-25 MED ORDER — FLECAINIDE ACETATE 100 MG PO TABS
100.0000 mg | ORAL_TABLET | Freq: Two times a day (BID) | ORAL | Status: DC
  Administered 2024-11-25 – 2024-11-27 (×4): 100 mg via ORAL
  Filled 2024-11-25 (×5): qty 1

## 2024-11-25 MED ORDER — ATORVASTATIN CALCIUM 20 MG PO TABS
20.0000 mg | ORAL_TABLET | Freq: Every day | ORAL | Status: DC
  Administered 2024-11-25 – 2024-11-27 (×3): 20 mg via ORAL
  Filled 2024-11-25 (×3): qty 1

## 2024-11-25 MED ORDER — ONDANSETRON HCL 4 MG/2ML IJ SOLN: 4.0000 mg | Freq: Four times a day (QID) | INTRAMUSCULAR | Status: DC | PRN

## 2024-11-25 MED ORDER — DILTIAZEM HCL 60 MG PO TABS
30.0000 mg | ORAL_TABLET | Freq: Four times a day (QID) | ORAL | Status: DC
  Administered 2024-11-25 – 2024-11-26 (×4): 30 mg via ORAL
  Filled 2024-11-25 (×4): qty 1

## 2024-11-25 MED ORDER — SODIUM CHLORIDE 0.9 % IV BOLUS
1000.0000 mL | Freq: Once | INTRAVENOUS | Status: AC
  Administered 2024-11-25: 1000 mL via INTRAVENOUS

## 2024-11-25 MED ORDER — PANTOPRAZOLE SODIUM 40 MG PO TBEC
40.0000 mg | DELAYED_RELEASE_TABLET | Freq: Every day | ORAL | Status: DC
  Administered 2024-11-25 – 2024-11-27 (×3): 40 mg via ORAL
  Filled 2024-11-25 (×3): qty 1

## 2024-11-25 MED ORDER — FUROSEMIDE 40 MG PO TABS: 20.0000 mg | ORAL_TABLET | Freq: Every day | ORAL | Status: DC | PRN

## 2024-11-25 MED ORDER — APIXABAN 5 MG PO TABS
5.0000 mg | ORAL_TABLET | Freq: Two times a day (BID) | ORAL | Status: DC
  Administered 2024-11-25 – 2024-11-27 (×4): 5 mg via ORAL
  Filled 2024-11-25 (×4): qty 1

## 2024-11-25 MED ORDER — APIXABAN 2.5 MG PO TABS
2.5000 mg | ORAL_TABLET | Freq: Two times a day (BID) | ORAL | Status: DC
  Administered 2024-11-25: 2.5 mg via ORAL
  Filled 2024-11-25: qty 1

## 2024-11-25 NOTE — Progress Notes (Signed)
 " NON-BILLABLE PROGRESS NOTE (SAME DAY AS ADMISSION)     Assumed Care on 11/25/24 at 7:00 AM. Patient evaluated this morning after overnight admission at 3:27 AM AM  by nocturnist.  This note reflects assumption of care and updated plan; H&P billed earlier today.    Lace Chenevert  FMW:[id number] DOB: 03/30/1935 DOA: 11/25/2024 PCP: Jeffie Cheryl BRAVO, MD    Brief Narrative:  89 year old female with PMHx of paroxysmal A-fib on flecainide  and Eliquis , CKD stage IIIb, depression, HLD, who presented to the ED on 11/25/2024 complaining of palpitations, nausea, and shortness of breath.  On EMSs arrival, heart was noted to be in the 150s to 200s for which she received a diltiazem  bolus.  In the ED, she was afebrile with temp of 97.6 F, RR 19, initial heart rate 169, BP 125/93, SpO2 100% on RA.  CBC and CMP were largely unremarkable.  High-sensitivity troponin 22 > 25.  proBNP 1425 (up from 470 10 days prior). EKG showed A-fib with RVR at 124 bpm.  CXR showed bibasilar opacities favored to be atelectasis.  Patient's BP dropped to 83/71 for which she received NS bolus.she was given PO metoprolol  tartrate 25 mg though continued to have intermittent heart rate to the 180s, and was ultimately started on Cardizem  drip. Subsequently, she converted to sinus bradycardia with HR dropping to 30s. Cardizem  drip was discontinued.   Assessment and Plan:  # Afib with RVR - Presented in Afib with RVR, initially 150-200s with EMS s/p diltiazem  bolus. Remained in RVR on arrival refractory to PO metoprolol  tartrate 25 mg. Started on cardizem  drip with subsequent conversion to sinus bradycardia prompting discontinuation of cardizem  drip - Home metoprolol  held given bradycardia\ - Continue Eliquis  2.5 mg BID - Cardiology consulted, awaiting further recommendations  # CKD stage IIIb  - Stable at baseline  # HLD - Continue home Lipitor 20 mg daily  # Nausea - PRN Zofran   # GERD - Continue PPI  # Overactive  bladder - Continue home oxybutynin     DVT prophylaxis: apixaban  (ELIQUIS ) tablet 2.5 mg Start: 11/25/24 1000 apixaban  (ELIQUIS ) tablet 2.5 mg   Code Status:   Code Status: Limited: Do not attempt resuscitation (DNR) -DNR-LIMITED -Do Not Intubate/DNI   Family Communication: Discussed with both daughters at bedside   Level of care: Progressive  Consultants:  Cardiology  Procedures:  None  Antimicrobials: None   Subjective: Patient examined at bedside.  Both daughters are at bedside.  Reports feeling better now, denies any chest pain, shortness of breath, dizziness.  States she has some lower extremity swelling for which she takes as needed Lasix  at home.  Reports mild nausea attributed to not having had anything to eat yet.  Otherwise denies abdominal pain, fevers, chills.  Objective: Vitals:   11/25/24 0516 11/25/24 0636 11/25/24 0744 11/25/24 0744  BP: 100/71 113/67    Pulse: 61 65    Resp: 17 18    Temp:      TempSrc:      SpO2: 100% 100% 100% 100%  Weight:      Height:        Intake/Output Summary (Last 24 hours) at 11/25/2024 0840 Last data filed at 11/25/2024 0218 Gross per 24 hour  Intake 1000 ml  Output --  Net 1000 ml   Filed Weights   11/25/24 0034  Weight: 72.6 kg    Examination:  Gen: NAD, A&Ox3 HEENT: NCAT Neck: Supple, no JVD CV: Bradycardic, regular rhythm, no murmurs Resp: normal WOB, CTAB,  no w/r/r Abd: Soft, NTND, no guarding, BS normoactive Ext: No LE edema, pulses 2+ b/l Skin: Warm, dry, right upper extremity with bruising and bandaged wound  Neuro: No focal deficits Psych: Calm, cooperative, appropriate affect   Data Reviewed: I have personally reviewed following labs and imaging studies  CBC: Recent Labs  Lab 11/25/24 0040  WBC 6.3  NEUTROABS 3.4  HGB 10.9*  HCT 32.0*  MCV 91.7  PLT 180   Basic Metabolic Panel: Recent Labs  Lab 11/25/24 0040  NA 137  K 3.9  CL 104  CO2 20*  GLUCOSE 175*  BUN 11  CREATININE 1.19*   CALCIUM  8.6*   GFR: Estimated Creatinine Clearance: 30.3 mL/min (A) (by C-G formula based on SCr of 1.19 mg/dL (H)). Liver Function Tests: Recent Labs  Lab 11/25/24 0040  AST 29  ALT 19  ALKPHOS 87  BILITOT 0.3  PROT 5.8*  ALBUMIN 3.6   No results for input(s): LIPASE, AMYLASE in the last 168 hours. No results for input(s): AMMONIA in the last 168 hours. Coagulation Profile: No results for input(s): INR, PROTIME in the last 168 hours. Cardiac Enzymes: No results for input(s): CKTOTAL, CKMB, CKMBINDEX, TROPONINI in the last 168 hours. BNP (last 3 results) Recent Labs    11/14/24 1915 11/25/24 0040  PROBNP 470.0* 1,425.0*   HbA1C: No results for input(s): HGBA1C in the last 72 hours. CBG: No results for input(s): GLUCAP in the last 168 hours. Lipid Profile: No results for input(s): CHOL, HDL, LDLCALC, TRIG, CHOLHDL, LDLDIRECT in the last 72 hours. Thyroid Function Tests: No results for input(s): TSH, T4TOTAL, FREET4, T3FREE, THYROIDAB in the last 72 hours. Anemia Panel: No results for input(s): VITAMINB12, FOLATE, FERRITIN, TIBC, IRON, RETICCTPCT in the last 72 hours. Sepsis Labs: No results for input(s): PROCALCITON, LATICACIDVEN in the last 168 hours.  No results found for this or any previous visit (from the past 240 hours).   Radiology Studies: DG Chest Port 1 View Result Date: 11/25/2024 EXAM: 1 VIEW(S) XRAY OF THE CHEST 11/25/2024 01:28:00 AM COMPARISON: 11/14/2024 CLINICAL HISTORY: Chest pain Chest pain Chest pain Chest pain FINDINGS: LUNGS AND PLEURA: Bibasilar opacities, favor atelectasis. No pleural effusion. No pneumothorax. HEART AND MEDIASTINUM: No acute abnormality of the cardiac and mediastinal silhouettes. BONES AND SOFT TISSUES: No acute osseous abnormality. IMPRESSION: 1. Bibasilar opacities, favored to represent atelectasis. Electronically signed by: Franky Crease MD 11/25/2024 01:35 AM EST RP  Workstation: [id number]    Scheduled Meds:  apixaban   2.5 mg Oral BID   atorvastatin   20 mg Oral Daily   flecainide   75 mg Oral BID   oxybutynin   5 mg Oral QHS   pantoprazole   40 mg Oral Daily   Continuous Infusions:  diltiazem  (CARDIZEM ) infusion Stopped (11/25/24 0453)     Unresulted Labs (From admission, onward)    None        LOS:  LOS: 0 days     Ziasia Lenoir Al-Sultani, MD [facility]  If 7PM-7AM, please contact night-coverage  11/25/2024, 8:40 AM      "

## 2024-11-25 NOTE — H&P (Signed)
 " History and Physical    Patient: Cynthia Conley DOB: April 30, 1935 DOA: 11/25/2024 DOS: the patient was seen and examined on 11/25/2024 PCP: Jeffie Cheryl BRAVO, MD  Patient coming from: Home  Chief Complaint:  Chief Complaint  Patient presents with   Atrial Fibrillation    HPI: Cynthia Conley is a 89 y.o. female with medical history significant for Paroxysmal A-fib on flecainide  and Eliquis , CKD 3B and depression being admitted with A-fib with RVR, hospitalized for the same in the past week.  She called EMS after experiencing palpitations associated with nausea and shortness of breath.  EMS found her with heart rates 150-200 for which she received a diltiazem  bolus. On arrival in the ED, in rapid A-fib at 169, BP 125/93. Labs significant for the following: - Troponin 22 and proBNP 1425 - Other labs at baseline including baseline creatinine of 1.19 at baseline hemoglobin of 10.9 EKG reading A-fib at 124 Chest x-ray showing bibasilar opacities favored to represent atelectasis Patient treated with an NS bolus and started on a Cardizem  infusion Admission requested     Past Medical History:  Diagnosis Date   Dysrhythmia    GERD (gastroesophageal reflux disease)    Hyperlipemia    Hypertension    IBS (irritable bowel syndrome) 06/09/2018   Peripheral vascular disease    Past Surgical History:  Procedure Laterality Date   ABDOMINAL HYSTERECTOMY     CHOLECYSTECTOMY     COLONOSCOPY     COLONOSCOPY WITH PROPOFOL  N/A 09/14/2018   Procedure: COLONOSCOPY WITH PROPOFOL ;  Surgeon: Unk Corinn Skiff, MD;  Location: [facility] ENDOSCOPY;  Service: Gastroenterology;  Laterality: N/A;   Social History:  reports that she has never smoked. She has never used smokeless tobacco. She reports that she does not drink alcohol and does not use drugs.  Allergies[1]  History reviewed. No pertinent family history.  Prior to Admission medications  Medication Sig Start Date End Date  Taking? Authorizing Provider  albuterol (PROAIR HFA) 108 (90 Base) MCG/ACT inhaler INHALE 2 INHALATIONS INTO THE LUNGS EVERY 6 HOURS AS NEEDED 05/16/17   [provider]  apixaban  (ELIQUIS ) 5 MG TABS tablet Take 2.5 mg by mouth 2 (two) times daily. 01/02/18   [provider]  atorvastatin  (LIPITOR) 20 MG tablet TAKE 1 TABLET BY MOUTH ONCE DAILY 08/03/17   [provider]  cetirizine  (ZYRTEC ) 5 MG tablet Take 1 tablet (5 mg total) by mouth daily. 06/29/22   Bernardino Ditch, NP  CYANOCOBALAMIN IJ Inject 1,000 mcg as directed every 30 (thirty) days.    [provider]  esomeprazole (NEXIUM) 40 MG capsule Take 40 mg by mouth daily at 12 noon.    [provider]  flecainide  (TAMBOCOR ) 50 MG tablet Take 1.5 tablets (75 mg total) by mouth 2 (two) times daily. 11/16/24 12/16/24  Franchot Novel, MD  [Paused] fluorouracil (EFUDEX) 5 % cream Apply 1 application topically 2 (two) times daily. Patient not taking: Reported on 11/14/2024 Wait to take this until your doctor or other care provider tells you to start again.    [provider]  FLUoxetine (PROZAC) 10 MG capsule Take 20 mg by mouth daily.  Patient not taking: Reported on 11/14/2024    [provider]  fluticasone (FLONASE) 50 MCG/ACT nasal spray Place 2 sprays into both nostrils daily.    [provider]  furosemide  (LASIX ) 20 MG tablet Take 20 mg by mouth daily as needed for edema. 08/02/24 08/02/25  [provider]  gabapentin  (NEURONTIN )  300 MG capsule Take 300 mg by mouth at bedtime.    [provider]  [Paused] hydrocortisone  (ANUSOL -HC) 2.5 % rectal cream APPLY TOPICALLY TO THE AFFECTED AREA THREE TIMES DAILY FOR 10 DAYS Patient not taking: Reported on 11/14/2024 Wait to take this until your doctor or other care provider tells you to start again. 04/23/20   Unk Corinn Skiff, MD  hydrOXYzine  (ATARAX ) 25 MG tablet Take 1 tablet (25 mg total) by mouth every 8 (eight) hours  as needed for itching. Patient not taking: Reported on 11/14/2024 10/29/23   Cook, Jayce G, DO  [Paused] lidocaine  (LIDODERM ) 5 % Place 1 patch onto the skin daily. Patient not taking: Reported on 11/14/2024 Wait to take this until your doctor or other care provider tells you to start again. 12/03/16   Brain Redell RAMAN, MD  metoprolol  succinate (TOPROL -XL) 25 MG 24 hr tablet Take 1 tablet (25 mg total) by mouth daily. Take with or immediately following a meal. 11/17/24   Franchot Novel, MD  nitroGLYCERIN  (NITROSTAT ) 0.4 MG SL tablet DIS 1 T UNDER THE TONGUE Q 5 MINUTES PRF CHEST PAIN. MY TAKE UP TO 3 DOSES 08/21/18   [provider]  oxybutynin  (DITROPAN -XL) 5 MG 24 hr tablet Take 5 mg by mouth at bedtime.    [provider]    Physical Exam: Vitals:   11/25/24 0105 11/25/24 0115 11/25/24 0144 11/25/24 0312  BP: 103/64 (!) 125/98 (!) 113/90 (!) 99/56  Pulse: (!) 168 (!) 124 (!) 140 (!) 106  Resp: 20 20 20  (!) 24  Temp:      SpO2: 100% 100% 100% 100%  Weight:      Height:       Physical Exam Vitals and nursing note reviewed.  Constitutional:      General: She is not in acute distress. HENT:     Head: Normocephalic and atraumatic.  Cardiovascular:     Rate and Rhythm: Tachycardia present. Rhythm irregular.     Heart sounds: Normal heart sounds.  Pulmonary:     Effort: Pulmonary effort is normal.     Breath sounds: Normal breath sounds.  Abdominal:     Palpations: Abdomen is soft.     Tenderness: There is no abdominal tenderness.  Neurological:     Mental Status: Mental status is at baseline.     Labs on Admission: I have personally reviewed following labs and imaging studies  CBC: Recent Labs  Lab 11/25/24 0040  WBC 6.3  NEUTROABS 3.4  HGB 10.9*  HCT 32.0*  MCV 91.7  PLT 180   Basic Metabolic Panel: Recent Labs  Lab 11/25/24 0040  NA 137  K 3.9  CL 104  CO2 20*  GLUCOSE 175*  BUN 11  CREATININE 1.19*  CALCIUM  8.6*   GFR: Estimated  Creatinine Clearance: 30.3 mL/min (A) (by C-G formula based on SCr of 1.19 mg/dL (H)). Liver Function Tests: Recent Labs  Lab 11/25/24 0040  AST 29  ALT 19  ALKPHOS 87  BILITOT 0.3  PROT 5.8*  ALBUMIN 3.6   No results for input(s): LIPASE, AMYLASE in the last 168 hours. No results for input(s): AMMONIA in the last 168 hours. Coagulation Profile: No results for input(s): INR, PROTIME in the last 168 hours. Cardiac Enzymes: No results for input(s): CKTOTAL, CKMB, CKMBINDEX, TROPONINI in the last 168 hours. BNP (last 3 results) Recent Labs    11/14/24 1915 11/25/24 0040  PROBNP 470.0* 1,425.0*   HbA1C: No results for input(s): HGBA1C in  the last 72 hours. CBG: No results for input(s): GLUCAP in the last 168 hours. Lipid Profile: No results for input(s): CHOL, HDL, LDLCALC, TRIG, CHOLHDL, LDLDIRECT in the last 72 hours. Thyroid Function Tests: No results for input(s): TSH, T4TOTAL, FREET4, T3FREE, THYROIDAB in the last 72 hours. Anemia Panel: No results for input(s): VITAMINB12, FOLATE, FERRITIN, TIBC, IRON, RETICCTPCT in the last 72 hours. Urine analysis:    Component Value Date/Time   COLORURINE STRAW (A) 11/14/2024 1915   APPEARANCEUR CLEAR (A) 11/14/2024 1915   APPEARANCEUR Clear 05/06/2014 1253   LABSPEC 1.002 (L) 11/14/2024 1915   LABSPEC 1.004 05/06/2014 1253   PHURINE 6.0 11/14/2024 1915   GLUCOSEU NEGATIVE 11/14/2024 1915   GLUCOSEU Negative 05/06/2014 1253   HGBUR NEGATIVE 11/14/2024 1915   BILIRUBINUR NEGATIVE 11/14/2024 1915   BILIRUBINUR Negative 05/06/2014 1253   KETONESUR NEGATIVE 11/14/2024 1915   PROTEINUR NEGATIVE 11/14/2024 1915   NITRITE NEGATIVE 11/14/2024 1915   LEUKOCYTESUR NEGATIVE 11/14/2024 1915   LEUKOCYTESUR Trace 05/06/2014 1253    Radiological Exams on Admission: DG Chest Port 1 View Result Date: 11/25/2024 EXAM: 1 VIEW(S) XRAY OF THE CHEST 11/25/2024 01:28:00 AM COMPARISON:  11/14/2024 CLINICAL HISTORY: Chest pain Chest pain Chest pain Chest pain FINDINGS: LUNGS AND PLEURA: Bibasilar opacities, favor atelectasis. No pleural effusion. No pneumothorax. HEART AND MEDIASTINUM: No acute abnormality of the cardiac and mediastinal silhouettes. BONES AND SOFT TISSUES: No acute osseous abnormality. IMPRESSION: 1. Bibasilar opacities, favored to represent atelectasis. Electronically signed by: Franky Crease MD 11/25/2024 01:35 AM EST RP Workstation: [id number]   Data Reviewed for HPI: Relevant notes from primary care and specialist visits, past discharge summaries as available in EHR, including Care Everywhere. Prior diagnostic testing as pertinent to current admission diagnoses Updated medications and problem lists for reconciliation ED course, including vitals, labs, imaging, treatment and response to treatment Triage notes, nursing and pharmacy notes and ED provider's notes Notable results as noted above in HPI      Assessment and Plan: * Atrial fibrillation with rapid ventricular response (HCC) Continue Cardizem  infusion with parameters per protocol Continue flecainide  Continue apixaban  Cardiology consult given frequent occurrence Addendum at 4:30 AM:  -Patient converted to sinus bradycardia at 30.  Drip discontinued.  Once heart rate picks up we will resume her metoprolol  -Continue close monitoring   CAD (coronary artery disease) No complaints of chest pain Continue GDMT  Stage 3b chronic kidney disease (HCC) At baseline  Hypertension Continue home meds        DVT prophylaxis: Apixaban   Consults: Cardiology  Advance Care Planning:   Code Status: Prior   Family Communication: 2 daughters at bedside  Disposition Plan: Back to previous home environment  Severity of Illness: The appropriate patient status for this patient is OBSERVATION. Observation status is judged to be reasonable and necessary in order to provide the required intensity of  service to ensure the patient's safety. The patient's presenting symptoms, physical exam findings, and initial radiographic and laboratory data in the context of their medical condition is felt to place them at decreased risk for further clinical deterioration. Furthermore, it is anticipated that the patient will be medically stable for discharge from the hospital within 2 midnights of admission.   Author: Delayne LULLA Solian, MD 11/25/2024 3:27 AM  For on call review [email or web address].      [1]  Allergies Allergen Reactions   Codeine Hives   Hydrochlorothiazide Other (See Comments)    hypokalemia   "

## 2024-11-25 NOTE — Consult Note (Signed)
 [facility] Cardiology    SUBJECTIVE: Patient resting comfortably in bed with significant improvement in palpitation tachycardia and heart racing denies any chest pain   Vitals:   11/25/24 0516 11/25/24 0636 11/25/24 0744 11/25/24 0744  BP: 100/71 113/67    Pulse: 61 65    Resp: 17 18    Temp:      TempSrc:      SpO2: 100% 100% 100% 100%  Weight:      Height:         Intake/Output Summary (Last 24 hours) at 11/25/2024 0834 Last data filed at 11/25/2024 9781 Gross per 24 hour  Intake 1000 ml  Output --  Net 1000 ml      PHYSICAL EXAM  General: Well developed, well nourished, in no acute distress HEENT:  Normocephalic and atramatic Neck:  No JVD.  Lungs: Clear bilaterally to auscultation and percussion. Heart: HRRR . Normal S1 and S2 without gallops or 2/6 sem murmurs.  Abdomen: Bowel sounds are positive, abdomen soft and non-tender  Msk:  Back normal, normal gait. Normal strength and tone for age. Extremities: No clubbing, cyanosis or edema.   Neuro: Alert and oriented X 3. Psych:  Good affect, responds appropriately   LABS: Basic Metabolic Panel: Recent Labs    11/25/24 0040  NA 137  K 3.9  CL 104  CO2 20*  GLUCOSE 175*  BUN 11  CREATININE 1.19*  CALCIUM  8.6*   Liver Function Tests: Recent Labs    11/25/24 0040  AST 29  ALT 19  ALKPHOS 87  BILITOT 0.3  PROT 5.8*  ALBUMIN 3.6   No results for input(s): LIPASE, AMYLASE in the last 72 hours. CBC: Recent Labs    11/25/24 0040  WBC 6.3  NEUTROABS 3.4  HGB 10.9*  HCT 32.0*  MCV 91.7  PLT 180   Cardiac Enzymes: No results for input(s): CKTOTAL, CKMB, CKMBINDEX, TROPONINI in the last 72 hours. BNP: Invalid input(s): POCBNP D-Dimer: No results for input(s): DDIMER in the last 72 hours. Hemoglobin A1C: No results for input(s): HGBA1C in the last 72 hours. Fasting Lipid Panel: No results for input(s): CHOL, HDL, LDLCALC, TRIG, CHOLHDL, LDLDIRECT in the last 72  hours. Thyroid Function Tests: No results for input(s): TSH, T4TOTAL, T3FREE, THYROIDAB in the last 72 hours.  Invalid input(s): FREET3 Anemia Panel: No results for input(s): VITAMINB12, FOLATE, FERRITIN, TIBC, IRON, RETICCTPCT in the last 72 hours.  DG Chest Port 1 View Result Date: 11/25/2024 EXAM: 1 VIEW(S) XRAY OF THE CHEST 11/25/2024 01:28:00 AM COMPARISON: 11/14/2024 CLINICAL HISTORY: Chest pain Chest pain Chest pain Chest pain FINDINGS: LUNGS AND PLEURA: Bibasilar opacities, favor atelectasis. No pleural effusion. No pneumothorax. HEART AND MEDIASTINUM: No acute abnormality of the cardiac and mediastinal silhouettes. BONES AND SOFT TISSUES: No acute osseous abnormality. IMPRESSION: 1. Bibasilar opacities, favored to represent atelectasis. Electronically signed by: Franky Crease MD 11/25/2024 01:35 AM EST RP Workstation: [id number]     Echo echo from 2023 with normal left ventricular function EF of greater than 55% moderate MR  TELEMETRY: Sinus bradycardia rate of 59 nonspecific findings:  ASSESSMENT AND PLAN:  Principal Problem:   Atrial fibrillation with rapid ventricular response (HCC) Active Problems:   Hypertension   CAD (coronary artery disease)   Stage 3b chronic kidney disease (HCC) GERD Peripheral vascular disease Dyslipidemia  Plan Recommend increasing flecainide  to 100 mg twice a day from 75 transition IV Cardizem  to p.o. recommend referral to EP for possible ablation continue Eliquis  for anticoagulation Bradycardia now in  sinus rhythm no clear occasion for permanent pacemaker continue current management Hypertension reasonably controlled continue current therapy Chronic renal sufficiency maintain adequate renal perfusion follow-up with nephrology GERD agree with PPI therapy for reflux type symptoms History of moderate MR in the past would recommend echocardiogram for further assessment   Cara JONETTA Lovelace, MD 11/25/2024 8:34 AM

## 2024-11-25 NOTE — Assessment & Plan Note (Addendum)
 Continue Cardizem  infusion with parameters per protocol Continue flecainide  Continue apixaban  Cardiology consult given frequent occurrence Addendum at 4:30 AM:  -Patient converted to sinus bradycardia at 30.  Drip discontinued.  Once heart rate picks up we will resume her metoprolol  -Continue close monitoring

## 2024-11-25 NOTE — Assessment & Plan Note (Signed)
 No complaints of chest pain Continue GDMT

## 2024-11-25 NOTE — ED Notes (Signed)
 Pt provided food and drink per request. Ok'd by MD Cleatus

## 2024-11-25 NOTE — ED Provider Notes (Signed)
 "  [facility] Provider Note   Event Date/Time   First MD Initiated Contact with Patient 11/25/24 0034     (approximate) History  Atrial Fibrillation  HPI Cynthia Conley is a 89 y.o. female with stated past medical history of CAD, paroxysmal atrial fibrillation, chronic renal insufficiency, GERD, hyperlipidemia, and hypertension who presents via EMS complaining of palpitations and chest pain.  Patient was given 10 mg of diltiazem  and route for atrial fibrillation with rapid ventricular response in the 200s.  Patient notes heart rate responded appropriately with improvement to heart rates in the 130s.  Patient also found to be hypotensive on arrival by EMS however first blood pressure on arrival to emergency department is 125/93.  Patient only complaining of palpitations at this time and mild shortness of breath. ROS: Patient currently denies any vision changes, tinnitus, difficulty speaking, facial droop, sore throat, abdominal pain, nausea/vomiting/diarrhea, dysuria, or weakness/numbness/paresthesias in any extremity   Physical Exam  Triage Vital Signs: ED Triage Vitals [11/25/24 0034]  Encounter Vitals Group     BP      Girls Systolic BP Percentile      Girls Diastolic BP Percentile      Boys Systolic BP Percentile      Boys Diastolic BP Percentile      Pulse      Resp      Temp      Temp src      SpO2      Weight      Height      Head Circumference      Peak Flow      Pain Score 3     Pain Loc      Pain Education      Exclude from Growth Chart    Most recent vital signs: Vitals:   11/26/24 0000 11/26/24 0130  BP: 130/65 125/61  Pulse: (!) 50 61  Resp: 20 16  Temp:    SpO2: 98%    General: Awake, oriented x4. CV:  Good peripheral perfusion.  Irregularly irregular rhythm.  No M GR Resp:  Normal effort.  CTAB Abd:  No distention. Other:  Elderly overweight Caucasian female resting comfortably in mild distress secondary to chest pain ED  Results / Procedures / Treatments  Labs (all labs ordered are listed, but only abnormal results are displayed) Labs Reviewed  COMPREHENSIVE METABOLIC PANEL WITH GFR - Abnormal; Notable for the following components:      Result Value   CO2 20 (*)    Glucose, Bld 175 (*)    Creatinine, Ser 1.19 (*)    Calcium  8.6 (*)    Total Protein 5.8 (*)    GFR, Estimated 44 (*)    All other components within normal limits  CBC WITH DIFFERENTIAL/PLATELET - Abnormal; Notable for the following components:   RBC 3.49 (*)    Hemoglobin 10.9 (*)    HCT 32.0 (*)    All other components within normal limits  PRO BRAIN NATRIURETIC PEPTIDE - Abnormal; Notable for the following components:   Pro Brain Natriuretic Peptide 1,425.0 (*)    All other components within normal limits  TROPONIN T, HIGH SENSITIVITY - Abnormal; Notable for the following components:   Troponin T High Sensitivity 22 (*)    All other components within normal limits  TROPONIN T, HIGH SENSITIVITY - Abnormal; Notable for the following components:   Troponin T High Sensitivity 25 (*)    All other components within normal limits  CBC  BASIC METABOLIC PANEL WITH GFR  MAGNESIUM   PHOSPHORUS   EKG ED ECG REPORT I, Artist MARLA Kerns, the attending physician, personally viewed and interpreted this ECG. Date: 11/25/2024 EKG Time: 0041 Rate: 124 Rhythm: Atrial fibrillation with rapid ventricular response QRS Axis: normal Intervals: normal ST/T Wave abnormalities: normal Narrative Interpretation: Atrial fibrillation with rapid ventricular response.  No evidence of acute ischemia RADIOLOGY ED MD interpretation: Single view portable chest x-ray shows bibasilar atelectasis - All radiology independently interpreted and agree with radiology assessment Official radiology report(s): No results found. PROCEDURES: Critical Care performed: Yes, see critical care procedure note(s) Procedures CRITICAL CARE Performed by: Sekai Nayak K Shaquira Moroz  Total  critical care time: 33 minutes  Critical care time was exclusive of separately billable procedures and treating other patients.  Critical care was necessary to treat or prevent imminent or life-threatening deterioration.  Critical care was time spent personally by me on the following activities: development of treatment plan with patient and/or surrogate as well as nursing, discussions with consultants, evaluation of patient's response to treatment, examination of patient, obtaining history from patient or surrogate, ordering and performing treatments and interventions, ordering and review of laboratory studies, ordering and review of radiographic studies, pulse oximetry and re-evaluation of patient's condition.  MEDICATIONS ORDERED IN ED: Medications  atorvastatin  (LIPITOR) tablet 20 mg (20 mg Oral Given by Other 11/25/24 1059)  furosemide  (LASIX ) tablet 20 mg (has no administration in time range)  pantoprazole  (PROTONIX ) EC tablet 40 mg (40 mg Oral Given 11/25/24 1059)  oxybutynin  (DITROPAN -XL) 24 hr tablet 5 mg (5 mg Oral Given 11/25/24 2106)  acetaminophen  (TYLENOL ) tablet 650 mg (has no administration in time range)  ondansetron  (ZOFRAN ) injection 4 mg (has no administration in time range)  ALPRAZolam  (XANAX ) tablet 0.25 mg (has no administration in time range)  flecainide  (TAMBOCOR ) tablet 100 mg (100 mg Oral Given 11/25/24 2106)  diltiazem  (CARDIZEM ) tablet 30 mg (30 mg Oral Given 11/25/24 2321)  apixaban  (ELIQUIS ) tablet 5 mg (5 mg Oral Given 11/25/24 2106)  metoprolol  tartrate (LOPRESSOR ) tablet 25 mg (25 mg Oral Given 11/25/24 0043)  sodium chloride  0.9 % bolus 1,000 mL (0 mLs Intravenous Stopped 11/25/24 0218)   IMPRESSION / MDM / ASSESSMENT AND PLAN / ED COURSE  I reviewed the triage vital signs and the nursing notes.                             The patient is on the cardiac monitor to evaluate for evidence of arrhythmia and/or significant heart rate changes. Patient's presentation is  most consistent with acute presentation with potential threat to life or bodily function. Patient is an 89 year old female with the above-stated past medical history who presents via EMS complaining of of chest pain and shortness of breath and found to be in atrial fibrillation w/ RVR DDx: Pneumothorax, Pneumonia, Pulmonary Embolus, Tamponade, ACS, Thyrotoxicosis.  No history or evidence decompensated heart failure. Given their history and exam it is likely this patient is unlikely to spontaneously revert to a rate controlled rhythm and necessitates a thorough workup for their arrhythmia. Workup: ECG, CXR, CBC, CMP, Troponin, BNP Interventions: Defer Cardioversion (hemodynamically stable). 25 mg oral metoprolol , patient continued to increase heart rate intermittently to the 180s Start diltiazem  bolus and drip  Disposition: Admit   FINAL CLINICAL IMPRESSION(S) / ED DIAGNOSES   Final diagnoses:  Atrial fibrillation with rapid ventricular response (HCC)  Palpitations   Rx / DC Orders  ED Discharge Orders          Ordered    Amb referral to AFIB [facility]        11/25/24 0330           Note:  This document was prepared using Dragon voice recognition software and may include unintentional dictation errors.   Jossie Artist POUR, MD 11/26/24 [phone]  "

## 2024-11-25 NOTE — ED Notes (Signed)
 MD at bedside.

## 2024-11-25 NOTE — Progress Notes (Signed)
" ° °  Brief Progress Note   _____________________________________________________________________________________________________________  Patient Name: Cynthia Conley Patient DOB: 10/27/1935 Date: @TODAY @      Data: Reviewed labs,notes, VS.      Action: No action needed at this time.      Response:    _____________________________________________________________________________________________________________  [facility] RN Expeditor Sharolyn JONETTA Batman Please contact us  directly via secure chat (search for [facility]) or by calling us  at [phone] [facility]).  "

## 2024-11-25 NOTE — ED Triage Notes (Signed)
 Pt presents for a-fib RVR. Hx of and recently hospitalized for same. Denies chest pain. Endorsing fluttering in right side of neck, mild nausea and mild SOB. Given 10 mg Cardizem  by EMS. HR 200s down to 150s.

## 2024-11-25 NOTE — Assessment & Plan Note (Signed)
 At baseline

## 2024-11-25 NOTE — Consult Note (Signed)
 " CARDIOLOGY CONSULT NOTE               Patient ID: Cynthia Conley MRN: [id number] DOB/AGE: 88-28-1936 88 y.o.  Admit date: 11/25/2024 Referring Physician Delayne Solian hospitalist Primary Physician Dr. Cheryl Jericho primary Primary Cardiologist [facility] MD Reason for Consultation paroxysmal atrial fibrillation RVR  HPI: 89 year old female with history of significant palpitations tachycardia atrial fibrillation paroxysmal has been on flecainide  and Eliquis  has renal insufficiency hypertension hyperlipidemia mild obesity has had recurrent episodes of tachycardia recently hospitalized about 10 days ago placed on diltiazem  and flecainide  was increased patient states she had a recurrent episode she came to the emergency room she has since converted to sinus rhythm feels much improved no evidence of pneumonia as she had in the past no chest pain no blackout spells or syncope  Review of systems complete and found to be negative unless listed above     Past Medical History:  Diagnosis Date   Dysrhythmia    GERD (gastroesophageal reflux disease)    Hyperlipemia    Hypertension    IBS (irritable bowel syndrome) 06/09/2018   Peripheral vascular disease     Past Surgical History:  Procedure Laterality Date   ABDOMINAL HYSTERECTOMY     CHOLECYSTECTOMY     COLONOSCOPY     COLONOSCOPY WITH PROPOFOL  N/A 09/14/2018   Procedure: COLONOSCOPY WITH PROPOFOL ;  Surgeon: Unk Corinn Skiff, MD;  Location: [facility] ENDOSCOPY;  Service: Gastroenterology;  Laterality: N/A;    (Not in a hospital admission)  Social History   Socioeconomic History   Marital status: Widowed    Spouse name: Not on file   Number of children: Not on file   Years of education: Not on file   Highest education level: Not on file  Occupational History   Not on file  Tobacco Use   Smoking status: Never   Smokeless tobacco: Never  Vaping Use   Vaping status: Never Used  Substance and Sexual Activity   Alcohol use: No    Drug use: Never   Sexual activity: Not on file  Other Topics Concern   Not on file  Social History Narrative   Not on file   Social Drivers of Health   Tobacco Use: Low Risk (11/25/2024)   Patient History    Smoking Tobacco Use: Never    Smokeless Tobacco Use: Never    Passive Exposure: Not on file  Financial Resource Strain: Low Risk  (04/19/2024)   Received from [facility]   Overall Financial Resource Strain (CARDIA)    Difficulty of Paying Living Expenses: Not hard at all  Food Insecurity: No Food Insecurity (04/19/2024)   Received from [facility]   Epic    Within the past 12 months, you worried that your food would run out before you got the money to buy more.: Never true    Within the past 12 months, the food you bought just didn't last and you didn't have money to get more.: Never true  Transportation Needs: No Transportation Needs (04/19/2024)   Received from [facility] - Transportation    In the past 12 months, has lack of transportation kept you from medical appointments or from getting medications?: No    Lack of Transportation (Non-Medical): No  Physical Activity: Not on file  Stress: Not on file  Social Connections: Not on file  Intimate Partner Violence: Not on file  Depression (EYV7-0): Not on file  Alcohol Screen:  Not on file  Housing: Low Risk  (08/02/2024)   Received from [facility]   Epic    In the last 12 months, was there a time when you were not able to pay the mortgage or rent on time?: No    In the past 12 months, how many times have you moved where you were living?: 0    At any time in the past 12 months, were you homeless or living in a shelter (including now)?: No  Utilities: Not At Risk (04/19/2024)   Received from [facility]   Epic    In the past 12 months has the electric, gas, oil, or water company threatened to shut off services in your home?:  No  Health Literacy: Not on file    History reviewed. No pertinent family history.    Review of systems complete and found to be negative unless listed above      PHYSICAL EXAM  General: Well developed, well nourished, in no acute distress HEENT:  Normocephalic and atramatic Neck:  No JVD.  Lungs: Clear bilaterally to auscultation and percussion. Heart: HRRR . Normal S1 and S2 without gallops or 2 /6 SEM murmurs.  Abdomen: Bowel sounds are positive, abdomen soft and non-tender  Msk:  Back normal, normal gait. Normal strength and tone for age. Extremities: No clubbing, cyanosis or edema.   Neuro: Alert and oriented X 3. Psych:  Good affect, responds appropriately  Labs:   Lab Results  Component Value Date   WBC 6.3 11/25/2024   HGB 10.9 (L) 11/25/2024   HCT 32.0 (L) 11/25/2024   MCV 91.7 11/25/2024   PLT 180 11/25/2024    Recent Labs  Lab 11/25/24 0040  NA 137  K 3.9  CL 104  CO2 20*  BUN 11  CREATININE 1.19*  CALCIUM  8.6*  PROT 5.8*  BILITOT 0.3  ALKPHOS 87  ALT 19  AST 29  GLUCOSE 175*   Lab Results  Component Value Date   TROPONINI < 0.02 05/06/2014   No results found for: CHOL No results found for: HDL No results found for: LDLCALC No results found for: TRIG No results found for: CHOLHDL No results found for: LDLDIRECT    Radiology: DG Chest Port 1 View Result Date: 11/25/2024 EXAM: 1 VIEW(S) XRAY OF THE CHEST 11/25/2024 01:28:00 AM COMPARISON: 11/14/2024 CLINICAL HISTORY: Chest pain Chest pain Chest pain Chest pain FINDINGS: LUNGS AND PLEURA: Bibasilar opacities, favor atelectasis. No pleural effusion. No pneumothorax. HEART AND MEDIASTINUM: No acute abnormality of the cardiac and mediastinal silhouettes. BONES AND SOFT TISSUES: No acute osseous abnormality. IMPRESSION: 1. Bibasilar opacities, favored to represent atelectasis. Electronically signed by: Franky Crease MD 11/25/2024 01:35 AM EST RP Workstation: [id number]   DG Chest Port 1  View Result Date: 11/14/2024 EXAM: 1 VIEW(S) XRAY OF THE CHEST 11/14/2024 07:30:00 PM COMPARISON: None available. CLINICAL HISTORY: Shortness of breath. FINDINGS: LUNGS AND PLEURA: Patchy atelectasis at left lung base. No pleural effusion. No pneumothorax. HEART AND MEDIASTINUM: Aortic atherosclerosis. No acute abnormality of the cardiac and mediastinal silhouettes. BONES AND SOFT TISSUES: Left shoulder degenerative joint disease. IMPRESSION: 1. Patchy atelectasis at the left lung base. Electronically signed by: Elsie Gravely MD 11/14/2024 07:35 PM EST RP Workstation: [id number]    EKG: Normal sinus rhythm 59 nonspecific ST-T wave changes  ASSESSMENT AND PLAN:  Principal Problem:   Atrial fibrillation with rapid ventricular response (HCC) Active Problems:   Hypertension   CAD (coronary artery disease)  Stage 3b chronic kidney disease (HCC) GERD Peripheral vascular disease Dyslipidemia  Plan Recommend increasing flecainide  to 100 mg twice a day from 75 transition IV Cardizem  to p.o. recommend referral to EP for possible ablation continue Eliquis  for anticoagulation Bradycardia now in sinus rhythm no clear occasion for permanent pacemaker continue current management Hypertension reasonably controlled continue current therapy Chronic renal sufficiency maintain adequate renal perfusion follow-up with nephrology GERD agree with PPI therapy for reflux type symptoms History of moderate MR in the past would recommend echocardiogram for further assessment  Signed: Cara JONETTA Lovelace MD 11/25/2024, 1:05 PM      "

## 2024-11-25 NOTE — Assessment & Plan Note (Signed)
 Continue home meds

## 2024-11-26 ENCOUNTER — Observation Stay: Admit: 2024-11-26 | Discharge: 2024-11-26 | Disposition: A

## 2024-11-26 ENCOUNTER — Observation Stay

## 2024-11-26 DIAGNOSIS — I4891 Unspecified atrial fibrillation: Secondary | ICD-10-CM | POA: Diagnosis not present

## 2024-11-26 LAB — BASIC METABOLIC PANEL WITH GFR
Anion gap: 9 (ref 5–15)
BUN: 9 mg/dL (ref 8–23)
CO2: 21 mmol/L — ABNORMAL LOW (ref 22–32)
Calcium: 8.9 mg/dL (ref 8.9–10.3)
Chloride: 110 mmol/L (ref 98–111)
Creatinine, Ser: 1.13 mg/dL — ABNORMAL HIGH (ref 0.44–1.00)
GFR, Estimated: 47 mL/min — ABNORMAL LOW
Glucose, Bld: 117 mg/dL — ABNORMAL HIGH (ref 70–99)
Potassium: 4.2 mmol/L (ref 3.5–5.1)
Sodium: 140 mmol/L (ref 135–145)

## 2024-11-26 LAB — CBC
HCT: 30.1 % — ABNORMAL LOW (ref 36.0–46.0)
HCT: 33.7 % — ABNORMAL LOW (ref 36.0–46.0)
Hemoglobin: 10.3 g/dL — ABNORMAL LOW (ref 12.0–15.0)
Hemoglobin: 11.7 g/dL — ABNORMAL LOW (ref 12.0–15.0)
MCH: 30.9 pg (ref 26.0–34.0)
MCH: 31.5 pg (ref 26.0–34.0)
MCHC: 34.2 g/dL (ref 30.0–36.0)
MCHC: 34.7 g/dL (ref 30.0–36.0)
MCV: 90.4 fL (ref 80.0–100.0)
MCV: 90.6 fL (ref 80.0–100.0)
Platelets: 180 K/uL (ref 150–400)
Platelets: 212 K/uL (ref 150–400)
RBC: 3.33 MIL/uL — ABNORMAL LOW (ref 3.87–5.11)
RBC: 3.72 MIL/uL — ABNORMAL LOW (ref 3.87–5.11)
RDW: 14.3 % (ref 11.5–15.5)
RDW: 14.3 % (ref 11.5–15.5)
WBC: 5.7 K/uL (ref 4.0–10.5)
WBC: 6.9 K/uL (ref 4.0–10.5)
nRBC: 0 % (ref 0.0–0.2)
nRBC: 0 % (ref 0.0–0.2)

## 2024-11-26 LAB — URINALYSIS, W/ REFLEX TO CULTURE (INFECTION SUSPECTED)
Bacteria, UA: NONE SEEN
Bilirubin Urine: NEGATIVE
Glucose, UA: NEGATIVE mg/dL
Hgb urine dipstick: NEGATIVE
Ketones, ur: NEGATIVE mg/dL
Leukocytes,Ua: NEGATIVE
Nitrite: NEGATIVE
Protein, ur: NEGATIVE mg/dL
RBC / HPF: 0 RBC/hpf (ref 0–5)
Specific Gravity, Urine: 1.005 (ref 1.005–1.030)
pH: 7 (ref 5.0–8.0)

## 2024-11-26 LAB — ECHOCARDIOGRAM COMPLETE
Area-P 1/2: 3.27 cm2
Calc EF: 67.4 %
Height: 61 in
MV M vel: 5.63 m/s
MV Peak grad: 126.8 mmHg
Radius: 0.3 cm
S' Lateral: 2.4 cm
Single Plane A2C EF: 68.6 %
Single Plane A4C EF: 65.9 %
Weight: 2560 [oz_av]

## 2024-11-26 LAB — MAGNESIUM: Magnesium: 2.1 mg/dL (ref 1.7–2.4)

## 2024-11-26 LAB — PHOSPHORUS: Phosphorus: 2.8 mg/dL (ref 2.5–4.6)

## 2024-11-26 LAB — PROCALCITONIN: Procalcitonin: <0.1 ng/mL

## 2024-11-26 MED ORDER — DILTIAZEM HCL ER COATED BEADS 180 MG PO CP24
180.0000 mg | ORAL_CAPSULE | Freq: Every day | ORAL | Status: DC
  Administered 2024-11-26 – 2024-11-27 (×2): 180 mg via ORAL
  Filled 2024-11-26 (×2): qty 1

## 2024-11-26 NOTE — Plan of Care (Signed)
   Problem: Activity: Goal: Risk for activity intolerance will decrease Outcome: Progressing

## 2024-11-26 NOTE — Plan of Care (Signed)

## 2024-11-26 NOTE — Progress Notes (Signed)
" °  Echocardiogram 2D Echocardiogram has been performed.  Xavier Munger 11/26/2024, 9:56 AM "

## 2024-11-26 NOTE — Care Management Obs Status (Signed)
 MEDICARE OBSERVATION STATUS NOTIFICATION   Patient Details  Name: Cynthia Conley MRN: [id number] Date of Birth: [birth year]   Medicare Observation Status Notification Given:  Chaney BRANDY CHRISTIANE LELON, CMA 11/26/2024, 3:26 PM

## 2024-11-26 NOTE — Progress Notes (Signed)
 [facility] CLINIC CARDIOLOGY PROGRESS NOTE   Patient ID: Cynthia Conley MRN: [id number] DOB/AGE: 88/20/1936 87 y.o.  Admit date: 11/25/2024 Referring Physician Dr. Delayne Solian  Primary Physician Feldpausch, Cheryl BRAVO, MD  Primary Cardiologist Dr. Florencio Reason for Consultation Paroxysmal AF RVR  HPI: Angelicia Lessner is a 89 y.o. female with a past medical history of coronary artery disease, valvular heart disease, paroxysmal atrial fibrillation, hypertension, hyperlipidemia, peripheral vascular disease who presented to the ED on 11/25/2024 for palpitations. Cardiology was consulted for further evaluation.   Interval History:  -Patient seen and examined this AM, resting in hospital bed with family at bedside.  -Reports feeling better overall today. Denies CP, SOB, palpitations.  -HR controlled on tele, no recurrence of AF.   Review of systems complete and found to be negative unless listed above   Vitals:   11/26/24 0445 11/26/24 0544 11/26/24 0600 11/26/24 0833  BP: 119/78 (!) 152/74 (!) 158/75 (!) 153/72  Pulse: (!) 59  (!) 56 (!) 57  Resp: 19  20 20   Temp:    98.2 F (36.8 C)  TempSrc:      SpO2: 99%   96%  Weight:      Height:        No intake or output data in the 24 hours ending 11/26/24 0953   PHYSICAL EXAM General: Chronically ill appearing elderly female, well nourished, in no acute distress. HEENT: Normocephalic and atraumatic. Neck: No JVD.  Lungs: Normal respiratory effort on room air. Clear bilaterally to auscultation. No wheezes, crackles, rhonchi.  Heart: HRRR. Normal S1 and S2 without gallops or murmurs. Radial & DP pulses 2+ bilaterally. Abdomen: Non-distended appearing.  Msk: Normal strength and tone for age. Extremities: No clubbing, cyanosis or edema.   Neuro: Alert and oriented X 3. Psych: Mood appropriate, affect congruent.    LABS: Basic Metabolic Panel: Recent Labs    11/25/24 0040 11/26/24 0813  NA 137 140  K 3.9 4.2  CL 104 110   CO2 20* 21*  GLUCOSE 175* 117*  BUN 11 9  CREATININE 1.19* 1.13*  CALCIUM  8.6* 8.9  MG  --  2.1  PHOS  --  2.8   Liver Function Tests: Recent Labs    11/25/24 0040  AST 29  ALT 19  ALKPHOS 87  BILITOT 0.3  PROT 5.8*  ALBUMIN 3.6   No results for input(s): LIPASE, AMYLASE in the last 72 hours. CBC: Recent Labs    11/25/24 0040 11/26/24 0813  WBC 6.3 5.7  NEUTROABS 3.4  --   HGB 10.9* 10.3*  HCT 32.0* 30.1*  MCV 91.7 90.4  PLT 180 180   Cardiac Enzymes: No results for input(s): CKTOTAL, CKMB, CKMBINDEX, TROPONINIHS in the last 72 hours. BNP: No results for input(s): BNP in the last 72 hours. D-Dimer: No results for input(s): DDIMER in the last 72 hours. Hemoglobin A1C: No results for input(s): HGBA1C in the last 72 hours. Fasting Lipid Panel: No results for input(s): CHOL, HDL, LDLCALC, TRIG, CHOLHDL, LDLDIRECT in the last 72 hours. Thyroid Function Tests: No results for input(s): TSH, T4TOTAL, T3FREE, THYROIDAB in the last 72 hours.  Invalid input(s): FREET3 Anemia Panel: No results for input(s): VITAMINB12, FOLATE, FERRITIN, TIBC, IRON, RETICCTPCT in the last 72 hours.  DG Chest Port 1 View Result Date: 11/25/2024 EXAM: 1 VIEW(S) XRAY OF THE CHEST 11/25/2024 01:28:00 AM COMPARISON: 11/14/2024 CLINICAL HISTORY: Chest pain Chest pain Chest pain Chest pain FINDINGS: LUNGS AND PLEURA: Bibasilar opacities, favor atelectasis. No  pleural effusion. No pneumothorax. HEART AND MEDIASTINUM: No acute abnormality of the cardiac and mediastinal silhouettes. BONES AND SOFT TISSUES: No acute osseous abnormality. IMPRESSION: 1. Bibasilar opacities, favored to represent atelectasis. Electronically signed by: Franky Crease MD 11/25/2024 01:35 AM EST RP Workstation: [id number]     ECHO pending  TELEMETRY (personally reviewed): sinus rhythm rate 70s  EKG (personally reviewed): AF RVR rate 161 bpm  DATA reviewed by me 11/26/24:  last 24h vitals tele labs imaging I/O, hospitalist progress note  Principal Problem:   Atrial fibrillation with rapid ventricular response (HCC) Active Problems:   Hypertension   CAD (coronary artery disease)   Stage 3b chronic kidney disease (HCC)    ASSESSMENT AND PLAN: Cynthia Conley is a 89 y.o. female with a past medical history of coronary artery disease, valvular heart disease, paroxysmal atrial fibrillation, hypertension, hyperlipidemia, peripheral vascular disease who presented to the ED on 11/25/2024 for palpitations. Cardiology was consulted for further evaluation.   # Paroxysmal atrial fibrillation # Atrial fibrillation RVR # Sinus bradycardia # Hypertension Patient presented with complaints of shortness of breath and palpitations, found to be in atrial fibrillation RVR with rate in the 160s. Started on IV diltiazem  infusion and converted to NSR.  -Consolidate PO diltiazem  to 180 mg daily.  -Continue flecainide  100 mg twice daily.  -Continue eliquis  5 mg twice daily for stroke risk reduction.  -Continue lasix  20 mg prn.  -Continue atorvastatin  20 mg daily.   This patient's case was discussed and created with Dr. Florencio and he is in agreement.  Signed:  Danita Bloch, PA-C  11/26/2024, 9:53 AM [facility] Cardiology

## 2024-11-26 NOTE — Progress Notes (Signed)
 " PROGRESS NOTE    Cynthia Conley  FMW:[id number] DOB: 02-Jan-1935 DOA: 11/25/2024 PCP: Jeffie Cheryl BRAVO, MD    Brief Narrative:  89 year old female with PMHx of paroxysmal A-fib on flecainide  and Eliquis , CKD stage IIIb, depression, HLD, who presented to the ED on 11/25/2024 complaining of palpitations, nausea, and shortness of breath.  On EMSs arrival, heart was noted to be in the 150s to 200s for which she received a diltiazem  bolus.   In the ED, she was afebrile with temp of 97.6 F, RR 19, initial heart rate 169, BP 125/93, SpO2 100% on RA.  CBC and CMP were largely unremarkable.  High-sensitivity troponin 22 > 25.  proBNP 1425 (up from 470 10 days prior). EKG showed A-fib with RVR at 124 bpm.  CXR showed bibasilar opacities favored to be atelectasis.  Patient's BP dropped to 83/71 for which she received NS bolus.she was given PO metoprolol  tartrate 25 mg though continued to have intermittent heart rate to the 180s, and was ultimately started on Cardizem  drip. Subsequently, she converted to sinus bradycardia with HR dropping to 30s. Cardizem  drip was discontinued.  She was admitted for management of Afib with RVR.  Assessment and Plan:  # Afib with RVR # Sinus bradycardia - Presented in Afib with RVR, initially 150-200s with EMS s/p diltiazem  bolus. Remained in RVR on arrival refractory to PO metoprolol  tartrate 25 mg. Started on cardizem  drip with subsequent conversion to sinus bradycardia prompting discontinuation of cardizem  drip - Home metoprolol  held given bradycardia - Continue Eliquis  5 mg BID - Cardiology consulted, recommended continuing flecainide  100 mg BID and switching to Cardizem  180 mg qd.  # Acute encephalopathy - Notified by patient's Cynthia Conley that she was becoming more confused and altered. On exam she appeared to have waxing and waning mentation.  - Appears most consistent with hospital delirium but will complete concise infectious workup given Cynthia Conley's concern -  UA unremarkable - CXR unremarkable - CBC unremarkable - Delirium precautions ordered  # CKD stage IIIb  - Stable at baseline   # HLD - Continue home Lipitor 20 mg daily   # Nausea - PRN Zofran    # GERD - Continue PPI   # Overactive bladder - Continue home oxybutynin     Scheduled Meds:  apixaban   5 mg Oral BID   atorvastatin   20 mg Oral Daily   diltiazem   30 mg Oral Q6H   flecainide   100 mg Oral BID   oxybutynin   5 mg Oral QHS   pantoprazole   40 mg Oral Daily   Continuous Infusions: PRN Meds:.acetaminophen , ALPRAZolam , furosemide , ondansetron  (ZOFRAN ) IV  Current Outpatient Medications  Medication Instructions   albuterol (PROAIR HFA) 108 (90 Base) MCG/ACT inhaler INHALE 2 INHALATIONS INTO THE LUNGS EVERY 6 HOURS AS NEEDED   apixaban  (ELIQUIS ) 2.5 mg, Oral, 2 times daily   atorvastatin  (LIPITOR) 20 MG tablet TAKE 1 TABLET BY MOUTH ONCE DAILY   cetirizine  (ZYRTEC ) 5 mg, Oral, Daily   CYANOCOBALAMIN IJ 1,000 mcg, Injection, Every 30 days   esomeprazole (NEXIUM) 40 mg, Oral, Daily   flecainide  (TAMBOCOR ) 75 mg, Oral, 2 times daily   [Paused] fluorouracil (EFUDEX) 5 % cream 1 application , 2 times daily   FLUoxetine (PROZAC) 20 mg, Daily   fluticasone (FLONASE) 50 MCG/ACT nasal spray 2 sprays, Each Nare, Daily   furosemide  (LASIX ) 20 mg, Oral, Daily PRN   gabapentin  (NEURONTIN ) 300 mg, Oral, Daily at bedtime   [Paused] hydrocortisone  (ANUSOL -HC) 2.5 % rectal cream APPLY TOPICALLY  TO THE AFFECTED AREA THREE TIMES DAILY FOR 10 DAYS   hydrOXYzine  (ATARAX ) 25 mg, Oral, Every 8 hours PRN   [Paused] lidocaine  (LIDODERM ) 5 % 1 patch, Transdermal, Every 24 hours   metoprolol  succinate (TOPROL -XL) 25 mg, Oral, Daily, Take with or immediately following a meal.   nitroGLYCERIN  (NITROSTAT ) 0.4 MG SL tablet DIS 1 T UNDER THE TONGUE Q 5 MINUTES PRF CHEST PAIN. MY TAKE UP TO 3 DOSES   oxybutynin  (DITROPAN -XL) 5 mg, Oral, Daily at bedtime    DVT prophylaxis:  apixaban  (ELIQUIS )  tablet 5 mg   Code Status:   Code Status: Limited: Do not attempt resuscitation (DNR) -DNR-LIMITED -Do Not Intubate/DNI   Family Communication: Discussed plan with Cynthia Conley at bedside  Disposition Plan: Home pending cardiology clearance PT -   -   OT -   -    Level of care: Progressive  Consultants:  Cardiology  Subjective: Patient examined this morning. Reports doing well, appears somewhat confused but overall can have a relatively normal conversation before saying something off. Cynthia Conley is very concerned that this is very abnormal  Objective: Vitals:   11/26/24 0441 11/26/24 0444 11/26/24 0445 11/26/24 0544  BP:   119/78 (!) 152/74  Pulse: 66  (!) 59   Resp: 19  19   Temp:  98.1 F (36.7 C)    TempSrc:  Oral    SpO2: 100%  99%   Weight:      Height:       No intake or output data in the 24 hours ending 11/26/24 0553 Filed Weights   11/25/24 0034  Weight: 72.6 kg    Examination:  Gen: NAD, A&Ox3 HEENT: NCAT Neck: Supple, CV: Bradycardic, regular rhythm, no murmurs Resp: normal WOB, CTAB, no w/r/r Abd: Soft, NTND, no guarding Ext: No LE edema, pulses 2+ b/l Skin: Warm, dry, right upper extremity with bruising and bandaged wound  Neuro: No focal deficits Psych: Calm, cooperative, appropriate affect    Data Reviewed: I have personally reviewed following labs and imaging studies  CBC: Recent Labs  Lab 11/25/24 0040  WBC 6.3  NEUTROABS 3.4  HGB 10.9*  HCT 32.0*  MCV 91.7  PLT 180   Basic Metabolic Panel: Recent Labs  Lab 11/25/24 0040  NA 137  K 3.9  CL 104  CO2 20*  GLUCOSE 175*  BUN 11  CREATININE 1.19*  CALCIUM  8.6*   GFR: Estimated Creatinine Clearance: 30.3 mL/min (A) (by C-G formula based on SCr of 1.19 mg/dL (H)). Liver Function Tests: Recent Labs  Lab 11/25/24 0040  AST 29  ALT 19  ALKPHOS 87  BILITOT 0.3  PROT 5.8*  ALBUMIN 3.6   No results for input(s): LIPASE, AMYLASE in the last 168 hours. No results for  input(s): AMMONIA in the last 168 hours. Coagulation Profile: No results for input(s): INR, PROTIME in the last 168 hours. Cardiac Enzymes: No results for input(s): CKTOTAL, CKMB, CKMBINDEX, TROPONINI in the last 168 hours. BNP (last 3 results) Recent Labs    11/14/24 1915 11/25/24 0040  PROBNP 470.0* 1,425.0*   HbA1C: No results for input(s): HGBA1C in the last 72 hours. CBG: No results for input(s): GLUCAP in the last 168 hours. Lipid Profile: No results for input(s): CHOL, HDL, LDLCALC, TRIG, CHOLHDL, LDLDIRECT in the last 72 hours. Thyroid Function Tests: No results for input(s): TSH, T4TOTAL, FREET4, T3FREE, THYROIDAB in the last 72 hours. Anemia Panel: No results for input(s): VITAMINB12, FOLATE, FERRITIN, TIBC, IRON, RETICCTPCT in the last 72  hours. Sepsis Labs: No results for input(s): PROCALCITON, LATICACIDVEN in the last 168 hours.  No results found for this or any previous visit (from the past 240 hours).   Radiology Studies: DG Chest Port 1 View Result Date: 11/25/2024 EXAM: 1 VIEW(S) XRAY OF THE CHEST 11/25/2024 01:28:00 AM COMPARISON: 11/14/2024 CLINICAL HISTORY: Chest pain Chest pain Chest pain Chest pain FINDINGS: LUNGS AND PLEURA: Bibasilar opacities, favor atelectasis. No pleural effusion. No pneumothorax. HEART AND MEDIASTINUM: No acute abnormality of the cardiac and mediastinal silhouettes. BONES AND SOFT TISSUES: No acute osseous abnormality. IMPRESSION: 1. Bibasilar opacities, favored to represent atelectasis. Electronically signed by: Franky Crease MD 11/25/2024 01:35 AM EST RP Workstation: [id number]    Scheduled Meds:  apixaban   5 mg Oral BID   atorvastatin   20 mg Oral Daily   diltiazem   30 mg Oral Q6H   flecainide   100 mg Oral BID   oxybutynin   5 mg Oral QHS   pantoprazole   40 mg Oral Daily   Continuous Infusions:   Unresulted Labs (From admission, onward)     Start     Ordered   11/26/24  0500  CBC  Tomorrow morning,   R        11/25/24 2253   11/26/24 0500  Basic metabolic panel with GFR  Tomorrow morning,   R        11/25/24 2253   11/26/24 0500  Magnesium   Tomorrow morning,   R        11/25/24 2253   11/26/24 0500  Phosphorus  Tomorrow morning,   R        11/25/24 2253             LOS:  LOS: 0 days   Time Spent: 45 minutes  Kamiah Fite Al-Sultani, MD [facility]  If 7PM-7AM, please contact night-coverage  11/26/2024, 5:53 AM      "

## 2024-11-27 ENCOUNTER — Other Ambulatory Visit: Payer: Self-pay

## 2024-11-27 DIAGNOSIS — I4891 Unspecified atrial fibrillation: Secondary | ICD-10-CM | POA: Diagnosis not present

## 2024-11-27 LAB — CBC
HCT: 31.9 % — ABNORMAL LOW (ref 36.0–46.0)
Hemoglobin: 10.9 g/dL — ABNORMAL LOW (ref 12.0–15.0)
MCH: 31.3 pg (ref 26.0–34.0)
MCHC: 34.2 g/dL (ref 30.0–36.0)
MCV: 91.7 fL (ref 80.0–100.0)
Platelets: 174 K/uL (ref 150–400)
RBC: 3.48 MIL/uL — ABNORMAL LOW (ref 3.87–5.11)
RDW: 14.3 % (ref 11.5–15.5)
WBC: 5.3 K/uL (ref 4.0–10.5)
nRBC: 0 % (ref 0.0–0.2)

## 2024-11-27 LAB — BASIC METABOLIC PANEL WITH GFR
Anion gap: 10 (ref 5–15)
BUN: 11 mg/dL (ref 8–23)
CO2: 23 mmol/L (ref 22–32)
Calcium: 8.8 mg/dL — ABNORMAL LOW (ref 8.9–10.3)
Chloride: 109 mmol/L (ref 98–111)
Creatinine, Ser: 1.12 mg/dL — ABNORMAL HIGH (ref 0.44–1.00)
GFR, Estimated: 47 mL/min — ABNORMAL LOW
Glucose, Bld: 92 mg/dL (ref 70–99)
Potassium: 3.8 mmol/L (ref 3.5–5.1)
Sodium: 141 mmol/L (ref 135–145)

## 2024-11-27 MED ORDER — FLECAINIDE ACETATE 100 MG PO TABS
100.0000 mg | ORAL_TABLET | Freq: Two times a day (BID) | ORAL | 0 refills | Status: AC
  Filled 2024-11-27: qty 60, 30d supply, fill #0

## 2024-11-27 MED ORDER — APIXABAN 5 MG PO TABS
5.0000 mg | ORAL_TABLET | Freq: Two times a day (BID) | ORAL | 0 refills | Status: AC
  Filled 2024-11-27: qty 60, 30d supply, fill #0

## 2024-11-27 MED ORDER — DILTIAZEM HCL ER COATED BEADS 180 MG PO CP24
180.0000 mg | ORAL_CAPSULE | Freq: Every day | ORAL | 0 refills | Status: AC
  Filled 2024-11-27: qty 30, 30d supply, fill #0

## 2024-11-27 NOTE — TOC Transition Note (Signed)
 Transition of Care [facility]) - Discharge Note   Patient Details  Name: Cynthia Conley MRN: [id number] Date of Birth: 08-Oct-1935  [facility] CM/SW Contact:  Lauraine JAYSON Carpen, LCSW Phone Number: 11/27/2024, 11:47 AM   Clinical Narrative:   Patient has orders to discharge home today. Patient has home health offers from [facility] and [facility]. Reviewed star ratings and she chose [facility]. CSW left message for [facility] liaison to notify. No further concerns. CSW signing off.  Final next level of care: Home w Home Health Services Barriers to Discharge: Barriers Resolved   Patient Goals and CMS Choice            Discharge Placement                  Name of family member notified: Shawnika Pepin Patient and family notified of of transfer: 11/27/24  Discharge Plan and Services Additional resources added to the After Visit Summary for       Post Acute Care Choice: Home Health, Durable Medical Equipment          DME Arranged: Vannie rolling DME Agency: [employer] Date DME Agency Contacted: 11/27/24   Representative spoke with at DME Agency: Thomas[facility] HH Arranged: PT, OT, Nurse's Aide HH Agency: [facility] Date [facility] Agency Contacted: 11/27/24   Representative spoke with at [facility] Agency: Larraine  Social Drivers of Health (SDOH) Interventions SDOH Screenings   Food Insecurity: No Food Insecurity (11/26/2024)  Housing: Unknown (11/26/2024)  Transportation Needs: No Transportation Needs (11/26/2024)  Utilities: Not At Risk (11/26/2024)  Financial Resource Strain: Low Risk  (04/19/2024)   Received from [facility]  Social Connections: Moderately Integrated (11/26/2024)  Tobacco Use: Low Risk (11/25/2024)     Readmission Risk Interventions     No data to display

## 2024-11-27 NOTE — Evaluation (Addendum)
 Occupational Therapy Evaluation Patient Details Name: Cynthia Conley MRN: [id number] DOB: 26-Oct-1935 Today's Date: 11/27/2024   History of Present Illness   Pt is a 89 y.o. female being admitted with A-fib with RVR, hospitalized for the same in the past week. Pt then developed  Acute encephalopathy. PMH of Paroxysmal A-fib, CKD 3B and depression     Clinical Impressions Pt was seen for OT evaluation this date. PTA, pt resides at home alone with level entry to her home. Pt is typically indep with ADLs and mobility without AD use, household and limited community distances. Daughter manages laundry and cleaning tasks and mostly brings pt her groceries. Pt with 1 fall ~1 week ago when trying to stand to change clothes.  Pt presents with deficits in strength and balance limiting their ability to perform ADL management at baseline level. Pt with mild confusion, initially stating she was in [place], then realized she was in [place]. Stated name, month and year as well as situation. She reports gross generalized weakness overall compared to her baseline. HR 74 following activity/mobility during session. Pt currently requires supervision with increased time/effort to perform bed mobility and SBA for STS from EOB. Standing grooming tasks performed with unilateral support on sink with supervision. Ambulated 1 lap around nursing station with CGA/SBA and LOB/instability noted during head turns, slow cautious gait. Anticipate improvement/confidence with RW use. Will follow acutely for OT services and recommend OT upon DC. Recommend RW and shower bench for safety with ADLs and mobility upon return home.      If plan is discharge home, recommend the following:   A little help with walking and/or transfers;A little help with bathing/dressing/bathroom;Help with stairs or ramp for entrance;Assistance with cooking/housework;Direct supervision/assist for financial management;Direct supervision/assist for  medications management;Supervision due to cognitive status     Functional Status Assessment   Patient has had a recent decline in their functional status and demonstrates the ability to make significant improvements in function in a reasonable and predictable amount of time.     Equipment Recommendations   Tub/shower bench;Other (comment) (RW)     Recommendations for Other Services         Precautions/Restrictions   Precautions Precautions: Fall Recall of Precautions/Restrictions: Impaired Restrictions Weight Bearing Restrictions Per Provider Order: No     Mobility Bed Mobility Overal bed mobility: Needs Assistance Bed Mobility: Supine to Sit     Supine to sit: Supervision, Used rails, HOB elevated     General bed mobility comments: increased time/effort    Transfers Overall transfer level: Needs assistance   Transfers: Sit to/from Stand Sit to Stand: Contact guard assist, Supervision           General transfer comment: noted with unsteadiness during higher level balance tasks without UE support, does well standing at sink counter to perform grooming tasks as long as she has unilateral support on sink; noted with LOB during head turns walking 1 lap around nursing station without AD use      Balance Overall balance assessment: Needs assistance Sitting-balance support: Feet supported Sitting balance-Leahy Scale: Normal     Standing balance support: During functional activity, No upper extremity supported Standing balance-Leahy Scale: Fair Standing balance comment: instability noted during head turns while ambulating without AD use; stable with unilateral support on sink during ADLs                           ADL either performed or assessed with  clinical judgement   ADL Overall ADL's : Needs assistance/impaired     Grooming: Wash/dry face;Wash/dry hands;Oral care;Brushing hair;Standing;Supervision/safety               Lower Body  Dressing: Supervision/safety;Sitting/lateral leans Lower Body Dressing Details (indicate cue type and reason): doff and donn socks             Functional mobility during ADLs: Supervision/safety;Contact guard assist       Vision         Perception         Praxis         Pertinent Vitals/Pain Pain Assessment Pain Assessment: No/denies pain     Extremity/Trunk Assessment Upper Extremity Assessment Upper Extremity Assessment: Generalized weakness   Lower Extremity Assessment Lower Extremity Assessment: Generalized weakness       Communication Communication Communication: No apparent difficulties   Cognition Arousal: Alert Behavior During Therapy: WFL for tasks assessed/performed               OT - Cognition Comments: per dtr still a little confused, but not as bad as yesterday                 Following commands: Intact       Cueing  General Comments   Cueing Techniques: Verbal cues  HR 74 after activity, 64 at rest in recliner   Exercises Other Exercises Other Exercises: Edu on role of OT in acute setting, DC recommendations and DME for safe return home. Daughter verbalized understanding.   Shoulder Instructions      Home Living Family/patient expects to be discharged to:: Private residence Living Arrangements: Alone Available Help at Discharge: Family;Available PRN/intermittently (daughter is retired but lives in [place]) Type of Home: House Home Access: Level entry     Home Layout: One level     Bathroom Shower/Tub: Doctor, General Practice: None          Prior Functioning/Environment Prior Level of Function : Independent/Modified Independent             Mobility Comments: no AD use household and some community distances, reports sometimes she will go to grocery store with her daughter, but mostly has daughter bring them; 1 recent fall when trying to take clothes off while  standing ADLs Comments: indep with ADLs, daughter reports she does the cleaning and laundry/most IADLs, daughter drives her to her MD appts    OT Problem List: Decreased strength;Decreased activity tolerance;Impaired balance (sitting and/or standing)   OT Treatment/Interventions: Self-care/ADL training;Therapeutic exercise;Therapeutic activities;DME and/or AE instruction;Patient/family education;Balance training      OT Goals(Current goals can be found in the care plan section)   Acute Rehab OT Goals Patient Stated Goal: improve balance OT Goal Formulation: With patient/family Time For Goal Achievement: 12/11/24 Potential to Achieve Goals: Good ADL Goals Pt Will Perform Lower Body Bathing: with modified independence;with supervision;sitting/lateral leans;sit to/from stand Pt Will Perform Lower Body Dressing: with modified independence;sit to/from stand;sitting/lateral leans Pt Will Transfer to Toilet: with modified independence;ambulating Pt/caregiver will Perform Home Exercise Program: Increased strength;Both right and left upper extremity;With theraband;With Supervision   OT Frequency:  Min 2X/week    Co-evaluation              AM-PAC OT 6 Clicks Daily Activity     Outcome Measure Help from another person eating meals?: None Help from another person taking care of personal grooming?: None Help from another person toileting,  which includes using toliet, bedpan, or urinal?: A Little Help from another person bathing (including washing, rinsing, drying)?: A Little Help from another person to put on and taking off regular upper body clothing?: None Help from another person to put on and taking off regular lower body clothing?: A Little 6 Click Score: 21   End of Session Equipment Utilized During Treatment: Gait belt Nurse Communication: Mobility status  Activity Tolerance: Patient tolerated treatment well Patient left: in chair;with call bell/phone within reach;with  chair alarm set  OT Visit Diagnosis: Other abnormalities of gait and mobility (R26.89);Unsteadiness on feet (R26.81);Muscle weakness (generalized) (M62.81)                Time: 9189-9150 OT Time Calculation (min): 39 min Charges:  OT General Charges $OT Visit: 1 Visit OT Evaluation $OT Eval Moderate Complexity: 1 Mod OT Treatments $Self Care/Home Management : 23-37 mins  Corneshia Hines Chrismon, OTR/L 11/27/24, 9:21 AM  Yailyn Strack E Chrismon 11/27/2024, 9:18 AM

## 2024-11-27 NOTE — Plan of Care (Signed)
 IV's removed, discharge instructions will be reviewed when daughter returns, she will be discharged to home

## 2024-11-27 NOTE — Evaluation (Signed)
 Physical Therapy Evaluation Patient Details Name: Cynthia Conley MRN: [id number] DOB: 15-Jun-1935 Today's Date: 11/27/2024  History of Present Illness  Pt is a 89 y.o. female being admitted with A-fib with RVR, hospitalized for the same in the past week. Pt then developed  Acute encephalopathy/hospital delirium. PMH of Paroxysmal A-fib, CKD 3B and depression  Clinical Impression  Patient seated in recliner upon arrival to session; supportive daughter present at bedside.  Patient alert and oriented, follows commands and eager for progressive mobility efforts (in hopes of discharge home).   Bilat UE/LE strength and ROM grossly symmetrical and WFL; no focal weakness appreciated.  No pain endorsed. Able to complete sit/stand, standing balance, basic transfers and gait (10') without assist device, cga/min assist.  Demonstrates very short, choppy steps with guarded trunk posturing, limited arm swing; notable deficits in balance reactions, confidence in gait ability without assist device    Completed an additional 180' with RW, close sup-reciprocal stepping pattern with improved step height/length, improved cadence and overall gait mechanics; subjectively voices increased confidence with use of assist device.   Dynamic balance deficits evident per BERG score, 27/56; significant difficulty with narrowed, tandem BOS and movement outside immediate BOS.   Do recommend continued use of RW with all mobiltiy efforts at this time. Mild/mod SOB with gait distance; HR at 74 peak during gait efforts Would benefit from skilled PT to address above deficits and promote optimal return to PLOF.; recommend post-acute PT follow up as indicated by interdisciplinary care team.        If plan is discharge home, recommend the following: A little help with walking and/or transfers;A little help with bathing/dressing/bathroom   Can travel by private vehicle        Equipment Recommendations Rolling walker (2 wheels)   Recommendations for Other Services       Functional Status Assessment Patient has had a recent decline in their functional status and demonstrates the ability to make significant improvements in function in a reasonable and predictable amount of time.     Precautions / Restrictions Precautions Precautions: Fall Restrictions Weight Bearing Restrictions Per Provider Order: No      Mobility  Bed Mobility               General bed mobility comments: seated in recliner beginning/end of treatment session    Transfers Overall transfer level: Needs assistance Equipment used: Rolling walker (2 wheels) Transfers: Sit to/from Stand Sit to Stand: Supervision           General transfer comment: able to complete sit/stand without UE support, but demonstrates limited balance outside immediate BOS    Ambulation/Gait Ambulation/Gait assistance: Contact guard assist, Supervision Gait Distance (Feet): 10 Feet Assistive device: None         General Gait Details: very short, choppy steps with guarded trunk posturing, limited arm swing; notable deficits in balance reactions, confidence in gait ability without assist device  Stairs            Wheelchair Mobility     Tilt Bed    Modified Rankin (Stroke Patients Only)       Balance Overall balance assessment: Needs assistance Sitting-balance support: No upper extremity supported, Feet supported Sitting balance-Leahy Scale: Good     Standing balance support: No upper extremity supported Standing balance-Leahy Scale: Poor                   Standardized Balance Assessment Standardized Balance Assessment : Programmer, Systems Test [facility] Balance Test  Sit to Stand: Able to stand without using hands and stabilize independently Standing Unsupported: Able to stand safely 2 minutes Sitting with Back Unsupported but Feet Supported on Floor or Stool: Able to sit safely and securely 2 minutes Stand to Sit: Controls descent  by using hands Transfers: Able to transfer with verbal cueing and /or supervision Standing Unsupported with Eyes Closed: Able to stand 10 seconds with supervision Standing Ubsupported with Feet Together: Needs help to attain position but able to stand for 30 seconds with feet together From Standing, Reach Forward with Outstretched Arm: Reaches forward but needs supervision From Standing Position, Pick up Object from Floor: Able to pick up shoe, needs supervision From Standing Position, Turn to Look Behind Over each Shoulder: Needs supervision when turning Turn 360 Degrees: Needs assistance while turning Standing Unsupported, Alternately Place Feet on Step/Stool: Able to complete >2 steps/needs minimal assist Standing Unsupported, One Foot in Front: Loses balance while stepping or standing Standing on One Leg: Unable to try or needs assist to prevent fall Total Score: 27         Pertinent Vitals/Pain Pain Assessment Pain Assessment: No/denies pain    Home Living Family/patient expects to be discharged to:: Private residence Living Arrangements: Alone Available Help at Discharge: Family;Available PRN/intermittently Type of Home: Apartment Home Access: Level entry       Home Layout: One level Home Equipment: None      Prior Function Prior Level of Function : Independent/Modified Independent             Mobility Comments: no AD use household and some community distances, reports sometimes she will go to grocery store with her daughter, but mostly has daughter bring them; 1 recent fall when trying to take clothes off while standing ADLs Comments: indep with ADLs, daughter reports she does the cleaning and laundry/most IADLs, daughter drives her to her MD appts     Extremity/Trunk Assessment   Upper Extremity Assessment Upper Extremity Assessment: Overall WFL for tasks assessed (grossly at least 4 to 4+/5 throughout)    Lower Extremity Assessment Lower Extremity  Assessment: Overall WFL for tasks assessed (grossly at least 4 to 4+/5 throughout)       Communication   Communication Communication: No apparent difficulties    Cognition Arousal: Alert Behavior During Therapy: WFL for tasks assessed/performed   PT - Cognitive impairments: No apparent impairments                       PT - Cognition Comments: intermittent confusion to more complex information, limited recall of new information Following commands: Intact       Cueing Cueing Techniques: Verbal cues     General Comments General comments (skin integrity, edema, etc.): HR 74 after activity, 64 at rest in recliner    Exercises Other Exercises Other Exercises: 180' with RW, close sup-reciprocal stepping pattern with improved step height/length, improved cadence and overall gait mechanics; subjectively voices increased confidence with use of assist device.  Do recommend continued use of RW with all mobiltiy efforts at this time. Mild/mod SOB with gait distance; HR at 74 peak during gait efforts   Assessment/Plan    PT Assessment Patient needs continued PT services  PT Problem List Decreased activity tolerance;Decreased balance;Decreased mobility;Decreased coordination;Decreased cognition;Decreased knowledge of use of DME;Decreased safety awareness;Decreased knowledge of precautions;Cardiopulmonary status limiting activity       PT Treatment Interventions Gait training;DME instruction;Functional mobility training;Therapeutic exercise;Therapeutic activities;Balance training;Cognitive remediation;Patient/family education    PT  Goals (Current goals can be found in the Care Plan section)  Acute Rehab PT Goals Patient Stated Goal: to return home PT Goal Formulation: With patient/family Time For Goal Achievement: 12/11/24 Potential to Achieve Goals: Good Additional Goals Additional Goal #1: Improve BERG by 7-10 points for optimal safety/indep with functional mobility tasks at  discharge    Frequency Min 2X/week     Co-evaluation               AM-PAC PT 6 Clicks Mobility  Outcome Measure Help needed turning from your back to your side while in a flat bed without using bedrails?: None Help needed moving from lying on your back to sitting on the side of a flat bed without using bedrails?: None Help needed moving to and from a bed to a chair (including a wheelchair)?: A Little Help needed standing up from a chair using your arms (e.g., wheelchair or bedside chair)?: A Little Help needed to walk in hospital room?: A Little Help needed climbing 3-5 steps with a railing? : A Little 6 Click Score: 20    End of Session   Activity Tolerance: Patient tolerated treatment well Patient left: in chair;with call bell/phone within reach;with chair alarm set;with family/visitor present Nurse Communication: Mobility status PT Visit Diagnosis: Muscle weakness (generalized) (M62.81);Difficulty in walking, not elsewhere classified (R26.2)    Time: 8994-8976 PT Time Calculation (min) (ACUTE ONLY): 18 min   Charges:   PT Evaluation $PT Eval Moderate Complexity: 1 Mod   PT General Charges $$ ACUTE PT VISIT: 1 Visit        Yaeli Hartung H. Delores, PT, DPT, NCS 11/27/24, 10:43 AM [phone]

## 2024-11-27 NOTE — TOC Initial Note (Signed)
 Transition of Care [facility]) - Initial/Assessment Note    Patient Details  Name: Cynthia Conley MRN: [id number] Date of Birth: Aug 20, 1935  [facility]) CM/SW Contact:    Lauraine JAYSON Carpen, LCSW Phone Number: 11/27/2024, 10:57 AM  Clinical Narrative:   CSW met with patient. Daughter at bedside. CSW introduced role and explained that therapy recommendations would be discussed. Patient and daughter are agreeable to home health therapy. CSW sent out referral to determine options. They are agreeable to RW. Ordered through [employer].                Expected Discharge Plan: Home w Home Health Services Barriers to Discharge: Continued Medical Work up   Patient Goals and CMS Choice            Expected Discharge Plan and Services     Post Acute Care Choice: Home Health, Durable Medical Equipment Living arrangements for the past 2 months: Apartment Expected Discharge Date: 11/27/24               DME Arranged: Vannie rolling DME Agency: [employer] Date DME Agency Contacted: 11/27/24   Representative spoke with at DME Agency: Thomasina            Prior Living Arrangements/Services Living arrangements for the past 2 months: Apartment Lives with:: Self Patient language and need for interpreter reviewed:: Yes Do you feel safe going back to the place where you live?: Yes      Need for Family Participation in Patient Care: Yes (Comment) Care giver support system in place?: Yes (comment)   Criminal Activity/Legal Involvement Pertinent to Current Situation/Hospitalization: No - Comment as needed  Activities of Daily Living      Permission Sought/Granted Permission sought to share information with : Facility Medical Sales Representative, Family Supports Permission granted to share information with : Yes, Verbal Permission Granted  Share Information with NAME: Ladawna Walgren  Permission granted to share info w AGENCY: Home Health Agencies  Permission granted to share info w  Relationship: Daughter  Permission granted to share info w Contact Information: [phone]  Emotional Assessment Appearance:: Appears stated age Attitude/Demeanor/Rapport: Engaged, Gracious Affect (typically observed): Accepting, Appropriate, Calm, Pleasant Orientation: : Oriented to Self, Oriented to Place, Oriented to Situation Alcohol / Substance Use: Not Applicable Psych Involvement: No (comment)  Admission diagnosis:  Palpitations [R00.2] Atrial fibrillation with rapid ventricular response (HCC) [I48.91] Patient Active Problem List   Diagnosis Date Noted   Atrial fibrillation with rapid ventricular response (HCC) 11/25/2024   GERD without esophagitis 11/15/2024   Dyslipidemia 11/15/2024   Depression 11/15/2024   Paroxysmal atrial fibrillation with RVR (HCC) 11/14/2024   IGT (impaired glucose tolerance) 04/23/2020   Stage 3b chronic kidney disease (HCC) 05/02/2019   Rectal bleeding    CAD (coronary artery disease) 06/09/2018   Allergic rhinitis 06/09/2018   DJD (degenerative joint disease) 06/09/2018   GERD (gastroesophageal reflux disease) 06/09/2018   Paroxysmal atrial fibrillation (HCC) 05/13/2017   Radiculopathy of lumbar region 01/10/2017   Biceps tendinitis of right upper extremity 07/05/2016   Disorder of right rotator cuff 07/05/2016   Lumbosacral spondylosis without myelopathy 07/05/2016   Myofascial pain 07/05/2016   Lumbar spinal stenosis 06/28/2016   Spondylolisthesis of lumbar region 06/28/2016   Frequent PVCs 02/25/2016   Degenerative arthritis of cervical spine 12/29/2015   Chronic pain of right knee 10/16/2015   Chronic right shoulder pain 09/15/2015   Overactive bladder 06/06/2015   Hypertension 04/18/2015   Moderate mitral insufficiency 11/19/2014   Moderate  tricuspid insufficiency 11/19/2014   Cervical radiculitis 07/05/2014   Elevated fasting blood sugar 06/07/2014   Mixed hyperlipidemia 04/18/2014   PCP:  Jeffie Cheryl BRAVO, MD Pharmacy:    [facility] [phone] [facility] [place] - [address] AT [facility] OF [address] [facility] [place] [address] Phone: [phone] Fax: [phone]  [facility] [address] [place] [address] Phone: [phone] Fax: [phone]     Social Drivers of Health (SDOH) Social History: SDOH Screenings   Food Insecurity: No Food Insecurity (11/26/2024)  Housing: Unknown (11/26/2024)  Transportation Needs: No Transportation Needs (11/26/2024)  Utilities: Not At Risk (11/26/2024)  Financial Resource Strain: Low Risk  (04/19/2024)   Received from [facility]  Social Connections: Moderately Integrated (11/26/2024)  Tobacco Use: Low Risk (11/25/2024)   SDOH Interventions:     Readmission Risk Interventions     No data to display

## 2024-11-27 NOTE — Progress Notes (Signed)
 [facility] PROGRESS NOTE   Patient ID: Cynthia Conley MRN: [id number] DOB/AGE: 1935/06/20 89 y.o.  Admit date: 11/25/2024 Referring Physician Dr. Delayne Solian  Primary Physician Feldpausch, Cheryl BRAVO, MD  Primary Cardiologist Dr. Florencio Reason for Consultation Paroxysmal AF RVR  HPI: Cynthia Conley is a 89 y.o. female with a past medical history of coronary artery disease, valvular heart disease, paroxysmal atrial fibrillation, hypertension, hyperlipidemia, peripheral vascular disease who presented to the ED on 11/25/2024 for palpitations. Cardiology was consulted for further evaluation.   Interval History:  -Patient seen and examined this AM, resting in Con[facility] bed with family at bedside.  -Reports feeling well today but feeling slightly weak. Denies CP, SOB, palpitations.  -HR controlled on tele, no recurrence of AF.   Review of systems complete and found to be negative unless listed above   Vitals:   11/26/24 1927 11/27/24 0106 11/27/24 0535 11/27/24 0846  BP: (!) 134/55 112/69 131/60 129/73  Pulse: 67 64 (!) 56 60  Resp: 18 18 16 20   Temp: 98.3 F (36.8 C) 98.1 F (36.7 C) 98.2 F (36.8 C) 98.9 F (37.2 C)  TempSrc: Oral Oral  Oral  SpO2: 94% 99% 95% 97%  Weight:      Height:         Intake/Output Summary (Last 24 hours) at 11/27/2024 0947 Last data filed at 11/26/2024 2200 Gross per 24 hour  Intake 120 ml  Output --  Net 120 ml     PHYSICAL EXAM General: Chronically ill appearing elderly female, well nourished, in no acute distress. HEENT: Normocephalic and atraumatic. Neck: No JVD.  Lungs: Normal respiratory effort on room air. Clear bilaterally to auscultation. No wheezes, crackles, rhonchi.  Heart: HRRR. Normal S1 and S2 without gallops or murmurs. Radial & DP pulses 2+ bilaterally. Abdomen: Non-distended appearing.  Msk: Normal strength and tone for age. Extremities: No clubbing, cyanosis or edema.   Neuro: Alert and oriented X  3. Psych: Mood appropriate, affect congruent.    LABS: Basic Metabolic Panel: Recent Labs    11/26/24 0813 11/27/24 0336  NA 140 141  K 4.2 3.8  CL 110 109  CO2 21* 23  GLUCOSE 117* 92  BUN 9 11  CREATININE 1.13* 1.12*  CALCIUM  8.9 8.8*  MG 2.1  --   PHOS 2.8  --    Liver Function Tests: Recent Labs    11/25/24 0040  AST 29  ALT 19  ALKPHOS 87  BILITOT 0.3  PROT 5.8*  ALBUMIN 3.6   No results for input(s): LIPASE, AMYLASE in the last 72 hours. CBC: Recent Labs    11/25/24 0040 11/26/24 0813 11/26/24 1748 11/27/24 0336  WBC 6.3   < > 6.9 5.3  NEUTROABS 3.4  --   --   --   HGB 10.9*   < > 11.7* 10.9*  HCT 32.0*   < > 33.7* 31.9*  MCV 91.7   < > 90.6 91.7  PLT 180   < > 212 174   < > = values in this interval not displayed.   Cardiac Enzymes: No results for input(s): CKTOTAL, CKMB, CKMBINDEX, TROPONINIHS in the last 72 hours. BNP: No results for input(s): BNP in the last 72 hours. D-Dimer: No results for input(s): DDIMER in the last 72 hours. Hemoglobin A1C: No results for input(s): HGBA1C in the last 72 hours. Fasting Lipid Panel: No results for input(s): CHOL, HDL, LDLCALC, TRIG, CHOLHDL, LDLDIRECT in the last 72 hours. Thyroid Function Tests:  No results for input(s): TSH, T4TOTAL, T3FREE, THYROIDAB in the last 72 hours.  Invalid input(s): FREET3 Anemia Panel: No results for input(s): VITAMINB12, FOLATE, FERRITIN, TIBC, IRON, RETICCTPCT in the last 72 hours.  DG Chest Port 1 View Result Date: 11/26/2024 EXAM: 1 VIEW(S) XRAY OF THE CHEST 11/26/2024 05:02:00 PM COMPARISON: Portable chest dated 11/25/2024 01:25 AM. CLINICAL HISTORY: Altered mental status. FINDINGS: LINES, TUBES AND DEVICES: Overlying telemetry leads. LUNGS AND PLEURA: Inspiration is somewhat improved today. There are bands of atelectasis in the lung bases. No focal pneumonia is evident. No pleural effusion. No pneumothorax. HEART AND  MEDIASTINUM: Stable mild cardiomegaly is seen without evidence of CHF. There is calcification in the transverse aorta with stable mediastinum. BONES AND SOFT TISSUES: Osteopenia and thoracic spondylosis. IMPRESSION: 1. Bibasilar atelectasis without evidence for focal pneumonia. 2. Stable mild cardiomegaly without evidence of CHF. Electronically signed by: Francis Quam MD 11/26/2024 08:32 PM EST RP Workstation: [id number]   ECHOCARDIOGRAM COMPLETE Result Date: 11/26/2024    ECHOCARDIOGRAM REPORT   Patient Name:   Cynthia [facility] Date of Exam: 11/26/2024 Medical Rec #:  [id number]         Height:       61.0 in Accession #:    [id number]        Weight:       160.0 lb Date of Birth:  [birth year]         BSA:          1.718 m Patient Age:    89 years          BP:           171/105 mmHg Patient Gender: F                 HR:           54 bpm. Exam Location:  Inpatient Procedure: 2D Echo (Both Spectral and Color Flow Doppler were utilized during            procedure). Indications:     Tricuspid valve disease. Atrial fibrillation.  History:         Patient has no prior history of Echocardiogram examinations.                  CAD, chronic kidney disease, Arrythmias:PVC; Risk                  Factors:Hypertension and Dyslipidemia.  Sonographer:     Tinnie Barefoot RDCS Referring Phys:  [id number] DUFFY AL-SULTANI Diagnosing Phys: Dwayne D Callwood MD IMPRESSIONS  1. Left ventricular ejection fraction, by estimation, is 65 to 70%. The left ventricle has normal function. The left ventricle has no regional wall motion abnormalities. Left ventricular diastolic parameters were normal.  2. Right ventricular systolic function is normal. The right ventricular size is normal. There is normal pulmonary artery systolic pressure.  3. The mitral valve is normal in structure. Moderate mitral valve regurgitation.  4. Tricuspid valve regurgitation is moderate.  5. The aortic valve is calcified. Aortic valve regurgitation is mild. Aortic  valve sclerosis is present, with no evidence of aortic valve stenosis. FINDINGS  Left Ventricle: Left ventricular ejection fraction, by estimation, is 65 to 70%. The left ventricle has normal function. The left ventricle has no regional wall motion abnormalities. Strain was performed and the global longitudinal strain is indeterminate. The left ventricular internal cavity size was normal in size. There is no left ventricular hypertrophy. Left ventricular diastolic parameters were normal. Right Ventricle: The  right ventricular size is normal. No increase in right ventricular wall thickness. Right ventricular systolic function is normal. There is normal pulmonary artery systolic pressure. The tricuspid regurgitant velocity is 2.73 m/s, and  with an assumed right atrial pressure of 3 mmHg, the estimated right ventricular systolic pressure is 32.8 mmHg. Left Atrium: Left atrial size was normal in size. Right Atrium: Right atrial size was normal in size. Pericardium: There is no evidence of pericardial effusion. Mitral Valve: The mitral valve is normal in structure. Moderate mitral valve regurgitation. Tricuspid Valve: The tricuspid valve is grossly normal. Tricuspid valve regurgitation is moderate. Aortic Valve: The aortic valve is calcified. Aortic valve regurgitation is mild. Aortic valve sclerosis is present, with no evidence of aortic valve stenosis. Pulmonic Valve: The pulmonic valve was not well visualized. Pulmonic valve regurgitation is not visualized. Aorta: The aortic root was not well visualized. IAS/Shunts: No atrial level shunt detected by color flow Doppler. Additional Comments: 3D was performed not requiring image post processing on an independent workstation and was indeterminate.  LEFT VENTRICLE PLAX 2D LVIDd:         3.90 cm     Diastology LVIDs:         2.40 cm     LV e' medial:    7.94 cm/s LV IVS:        0.90 cm     LV E/e' medial:  11.4 LVOT diam:     1.90 cm     LV e' lateral:   7.29 cm/s LVOT  Area:     2.84 cm    LV E/e' lateral: 12.4 LV IVRT:       95 msec  LV Volumes (MOD) LV vol d, MOD A2C: 71.6 ml LV vol d, MOD A4C: 65.7 ml LV vol s, MOD A2C: 22.5 ml LV vol s, MOD A4C: 22.4 ml LV SV MOD A2C:     49.1 ml LV SV MOD A4C:     65.7 ml LV SV MOD BP:      46.4 ml RIGHT VENTRICLE             IVC RV Basal diam:  3.20 cm     IVC diam: 1.90 cm RV S prime:     16.90 cm/s TAPSE (M-mode): 2.3 cm      PULMONARY VEINS                             Diastolic Velocity: 35.50 cm/s                             S/D Velocity:       1.70                             Systolic Velocity:  62.10 cm/s LEFT ATRIUM             Index        RIGHT ATRIUM           Index LA diam:        3.00 cm 1.75 cm/m   RA Area:     15.80 cm LA Vol (A2C):   53.5 ml 31.14 ml/m  RA Volume:   39.70 ml  23.11 ml/m LA Vol (A4C):   48.2 ml 28.06 ml/m LA Biplane Vol: 52.5 ml 30.56 ml/m   AORTA Ao Root diam:  3.20 cm Ao Asc diam:  3.60 cm MITRAL VALVE                  TRICUSPID VALVE MV Area (PHT): 3.27 cm       TR Peak grad:   29.8 mmHg MV Decel Time: 232 msec       TR Vmax:        273.00 cm/s MR Peak grad:    126.8 mmHg MR Mean grad:    86.0 mmHg    SHUNTS MR Vmax:         563.00 cm/s  Systemic Diam: 1.90 cm MR Vmean:        436.0 cm/s MR PISA:         0.57 cm MR PISA Eff ROA: 4 mm MR PISA Radius:  0.30 cm MV E velocity: 90.70 cm/s MV A velocity: 88.00 cm/s MV E/A ratio:  1.03 Dwayne D Callwood MD Electronically signed by Cara JONETTA Lovelace MD Signature Date/Time: 11/26/2024/2:01:21 PM    Final      ECHO as above  TELEMETRY (personally reviewed): sinus rhythm rate 60s  EKG (personally reviewed): AF RVR rate 161 bpm  DATA reviewed by me 11/27/24: last 24h vitals tele labs imaging I/O, hospitalist progress note  Principal Problem:   Atrial fibrillation with rapid ventricular response (HCC) Active Problems:   Hypertension   CAD (coronary artery disease)   Stage 3b chronic kidney disease (HCC)    ASSESSMENT AND PLAN: Nusrat Encarnacion is a 89 y.o. female with a past medical history of coronary artery disease, valvular heart disease, paroxysmal atrial fibrillation, hypertension, hyperlipidemia, peripheral vascular disease who presented to the ED on 11/25/2024 for palpitations. Cardiology was consulted for further evaluation.   # Paroxysmal atrial fibrillation # Atrial fibrillation RVR # Sinus bradycardia # Hypertension Patient presented with complaints of shortness of breath and palpitations, found to be in atrial fibrillation RVR with rate in the 160s. Started on IV diltiazem  infusion and converted to NSR. Echo this admission with EF 65-70%, no WMAs, moderate MR and TR.  -Continue PO diltiazem  180 mg daily.  -Continue flecainide  100 mg twice daily.  -Continue eliquis  5 mg twice daily for stroke risk reduction.  -Continue lasix  20 mg prn.  -Continue atorvastatin  20 mg daily.   Patient is stable for DC from cardiac perspective.  This patient's case was discussed and created with Dr. Lovelace and he is in agreement.  Signed:  Danita Bloch, PA-C  11/27/2024, 9:47 AM [facility] Cardiology

## 2024-11-30 ENCOUNTER — Emergency Department
Admission: EM | Admit: 2024-11-30 | Discharge: 2024-11-30 | Disposition: A | Discharge status: Home / Self Care | Attending: Emergency Medicine | Admitting: Emergency Medicine

## 2024-11-30 ENCOUNTER — Other Ambulatory Visit: Payer: Self-pay

## 2024-11-30 DIAGNOSIS — I4819 Other persistent atrial fibrillation: Secondary | ICD-10-CM | POA: Diagnosis not present

## 2024-11-30 DIAGNOSIS — Z7901 Long term (current) use of anticoagulants: Secondary | ICD-10-CM | POA: Insufficient documentation

## 2024-11-30 DIAGNOSIS — R002 Palpitations: Secondary | ICD-10-CM

## 2024-11-30 DIAGNOSIS — I10 Essential (primary) hypertension: Secondary | ICD-10-CM | POA: Diagnosis not present

## 2024-11-30 LAB — CBC WITH DIFFERENTIAL/PLATELET
Abs Immature Granulocytes: 0.02 K/uL (ref 0.00–0.07)
Basophils Absolute: 0.1 K/uL (ref 0.0–0.1)
Basophils Relative: 1 %
Eosinophils Absolute: 0.2 K/uL (ref 0.0–0.5)
Eosinophils Relative: 3 %
HCT: 31.2 % — ABNORMAL LOW (ref 36.0–46.0)
Hemoglobin: 10.4 g/dL — ABNORMAL LOW (ref 12.0–15.0)
Immature Granulocytes: 0 %
Lymphocytes Relative: 18 %
Lymphs Abs: 1 K/uL (ref 0.7–4.0)
MCH: 31 pg (ref 26.0–34.0)
MCHC: 33.3 g/dL (ref 30.0–36.0)
MCV: 92.9 fL (ref 80.0–100.0)
Monocytes Absolute: 0.4 K/uL (ref 0.1–1.0)
Monocytes Relative: 8 %
Neutro Abs: 4 K/uL (ref 1.7–7.7)
Neutrophils Relative %: 70 %
Platelets: 207 K/uL (ref 150–400)
RBC: 3.36 MIL/uL — ABNORMAL LOW (ref 3.87–5.11)
RDW: 14.3 % (ref 11.5–15.5)
WBC: 5.6 K/uL (ref 4.0–10.5)
nRBC: 0 % (ref 0.0–0.2)

## 2024-11-30 LAB — COMPREHENSIVE METABOLIC PANEL WITH GFR
ALT: 15 U/L (ref 0–44)
AST: 20 U/L (ref 15–41)
Albumin: 3.5 g/dL (ref 3.5–5.0)
Alkaline Phosphatase: 71 U/L (ref 38–126)
Anion gap: 10 (ref 5–15)
BUN: 15 mg/dL (ref 8–23)
CO2: 23 mmol/L (ref 22–32)
Calcium: 8.7 mg/dL — ABNORMAL LOW (ref 8.9–10.3)
Chloride: 110 mmol/L (ref 98–111)
Creatinine, Ser: 1.21 mg/dL — ABNORMAL HIGH (ref 0.44–1.00)
GFR, Estimated: 43 mL/min — ABNORMAL LOW
Glucose, Bld: 97 mg/dL (ref 70–99)
Potassium: 4.3 mmol/L (ref 3.5–5.1)
Sodium: 143 mmol/L (ref 135–145)
Total Bilirubin: 0.3 mg/dL (ref 0.0–1.2)
Total Protein: 5.6 g/dL — ABNORMAL LOW (ref 6.5–8.1)

## 2024-11-30 LAB — TROPONIN T, HIGH SENSITIVITY: Troponin T High Sensitivity: 19 ng/L (ref 0–19)

## 2024-11-30 NOTE — ED Notes (Signed)
 This RN spoke with Cynthia [facility], pts daughter listed in chart, and she stated she is on her way to pick pt up

## 2024-11-30 NOTE — ED Triage Notes (Addendum)
 Pt arrives via [facility] from home for palpitations. Hx of afib and recently admitted for afib RVR on the 18th. Is currently on eliquis . EMS gave about 400 mL of fluids  EMS vitals: 160/79 BP 123 CBG

## 2024-11-30 NOTE — ED Provider Notes (Signed)
 "  [facility] Provider Note   None    (approximate) History  Palpitations  HPI Cynthia Conley is a 89 y.o. female with a past medical history of hypertension, GERD, hyperlipidemia, peripheral arterial disease, and atrial fibrillation on Eliquis  and questionable adherence to Cardizem  who presents via EMS after complaining of palpitations and orthostatic lightheadedness that began this morning.  Patient states that the symptoms were similar to the symptoms of A-fib RVR that she had to be admitted for in the past and wanted to come to the emergency department before it got as bad as last time.  EMS noted patient to be in A-fib however was rate controlled with rate between 80-100.  Patient denies any chest pain or shortness of breath.  Patient also states that the palpitations have somewhat resolved at this time. ROS: Patient currently denies any vision changes, tinnitus, difficulty speaking, facial droop, sore throat, chest pain, shortness of breath, abdominal pain, nausea/vomiting/diarrhea, dysuria, or weakness/numbness/paresthesias in any extremity   Physical Exam  Triage Vital Signs: ED Triage Vitals  Encounter Vitals Group     BP      Girls Systolic BP Percentile      Girls Diastolic BP Percentile      Boys Systolic BP Percentile      Boys Diastolic BP Percentile      Pulse      Resp      Temp      Temp src      SpO2      Weight      Height      Head Circumference      Peak Flow      Pain Score      Pain Loc      Pain Education      Exclude from Growth Chart    Most recent vital signs: Vitals:   11/30/24 1300 11/30/24 1345  BP: 134/67 136/76  Pulse:  60  Resp: 13 16  Temp:    SpO2:  97%   General: Awake, oriented x4. CV:  Good peripheral perfusion. Resp:  Normal effort. Abd:  No distention. Other:  Elderly obese Caucasian female resting comfortably in no acute distress ED Results / Procedures / Treatments  Labs (all labs ordered are  listed, but only abnormal results are displayed) Labs Reviewed  COMPREHENSIVE METABOLIC PANEL WITH GFR - Abnormal; Notable for the following components:      Result Value   Creatinine, Ser 1.21 (*)    Calcium  8.7 (*)    Total Protein 5.6 (*)    GFR, Estimated 43 (*)    All other components within normal limits  CBC WITH DIFFERENTIAL/PLATELET - Abnormal; Notable for the following components:   RBC 3.36 (*)    Hemoglobin 10.4 (*)    HCT 31.2 (*)    All other components within normal limits  TROPONIN T, HIGH SENSITIVITY   EKG ED ECG REPORT I, Artist MARLA Kerns, the attending physician, personally viewed and interpreted this ECG. Date: 11/30/2024 EKG Time: 1122 Rate: 63 Rhythm: normal sinus rhythm QRS Axis: normal Intervals: normal ST/T Wave abnormalities: normal Narrative Interpretation: no evidence of acute ischemia PROCEDURES: Critical Care performed: No Procedures MEDICATIONS ORDERED IN ED: Medications - No data to display IMPRESSION / MDM / ASSESSMENT AND PLAN / ED COURSE  I reviewed the triage vital signs and the nursing notes.  The patient is on the cardiac monitor to evaluate for evidence of arrhythmia and/or significant heart rate changes. Patient's presentation is most consistent with acute presentation with potential threat to life or bodily function. Patient is an 89 year old female with the above-stated past medical history presents complaining of palpitations. DDx: A-fib with RVR, ACS, CHF, SVT Plan: CBC, CMP, troponin, EKG  Patient's laboratory evaluation EKG only significant for rate controlled A-fib.  Patient shows no signs of hemodynamic instability.  Patient shows no signs of A-fib with RVR.  Patient is stable for discharge at this time.  Patient agrees with plan for discharge home with outpatient cardiology follow-up.  Patient given strict return precautions and all questions were answered prior to discharge  Dispo: Discharge home  with PCP follow-up   FINAL CLINICAL IMPRESSION(S) / ED DIAGNOSES   Final diagnoses:  Palpitations  Persistent atrial fibrillation (HCC)   Rx / DC Orders   ED Discharge Orders     None      Note:  This document was prepared using Dragon voice recognition software and may include unintentional dictation errors.   Jossie Artist POUR, MD 11/30/24 1535  "

## 2024-12-02 NOTE — Discharge Summary (Signed)
 " Physician Discharge Summary   Patient: Cynthia Conley MRN: [id number] DOB: 03/31/1935  Admit date:     11/25/2024  Discharge date: 11/27/2024  Discharge Physician: Duffy Al-Sultani   PCP: Jeffie Cheryl BRAVO, MD   Recommendations at discharge:   Follow up with PCP within 1 week of discharge Follow up with cardiology as scheduled  Discharge Diagnoses: Principal Problem:   Atrial fibrillation with rapid ventricular response (HCC) Active Problems:   CAD (coronary artery disease)   Hypertension   Stage 3b chronic kidney disease (HCC)  Resolved Problems:   * No resolved hospital problems. *   Hospital Course:  89 year old female with PMHx of paroxysmal A-fib on flecainide  and Eliquis , CKD stage IIIb, depression, HLD, who presented to the ED on 11/25/2024 complaining of palpitations, nausea, and shortness of breath.  On EMSs arrival, heart was noted to be in the 150s to 200s for which she received a diltiazem  bolus.   In the ED, she was afebrile with temp of 97.6 F, RR 19, initial heart rate 169, BP 125/93, SpO2 100% on RA.  CBC and CMP were largely unremarkable.  High-sensitivity troponin 22 > 25.  proBNP 1425 (up from 470 10 days prior). EKG showed A-fib with RVR at 124 bpm.  CXR showed bibasilar opacities favored to be atelectasis.  Patient's BP dropped to 83/71 for which she received NS bolus.she was given PO metoprolol  tartrate 25 mg though continued to have intermittent heart rate to the 180s, and was ultimately started on Cardizem  drip. Subsequently, she converted to sinus bradycardia with HR dropping to 30s. Cardizem  drip was discontinued.  She was admitted for management of Afib with RVR.   Assessment and Plan:   # Afib with RVR # Sinus bradycardia - Presented in Afib with RVR, initially 150-200s with EMS s/p diltiazem  bolus. Remained in RVR on arrival refractory to PO metoprolol  tartrate 25 mg. Started on cardizem  drip with subsequent conversion to sinus bradycardia  prompting discontinuation of cardizem  drip - Echo showed LVEF 65-70%, no RWMA, normal PASP, moderate MR, moderate TR - Cardiology consulted, recommended continuing flecainide  100 mg BID and switching to Cardizem  180 mg qd, cleared patient for discharge on that regimen. - Continue Eliquis  5 mg BID - Follow up with cardiology, Dr. Florencio, on 12/06/2024  # Acute encephalopathy - resolved - Notified by patient's daughter that she was becoming more confused and altered after being hospitalized for a day or so. On exam she appeared to have waxing and waning mentation. Appeared most consistent with hospital delirium. Complete concise infectious workup given daughter's concern, which was largely unremarkable. The following day, that patient was back at her mental baseline, AOx4.    # CKD stage IIIb  - Stable at baseline   # HLD - Continue home Lipitor 20 mg daily   # Nausea - PRN Zofran    # GERD - Continue PPI   # Overactive bladder - Continue home oxybutynin         Consultants: Cardiology  Procedures performed: None  Disposition: Home health Diet recommendation:   DISCHARGE MEDICATION: Allergies as of 11/27/2024       Reactions   Codeine Hives   Hydrochlorothiazide Other (See Comments)   hypokalemia        Medication List     STOP taking these medications    fluorouracil 5 % cream Commonly known as: EFUDEX   FLUoxetine 10 MG capsule Commonly known as: PROZAC   hydrocortisone  2.5 % rectal cream Commonly known as: ANUSOL -HC  hydrOXYzine  25 MG tablet Commonly known as: ATARAX    lidocaine  5 % Commonly known as: Lidoderm    metoprolol  succinate 25 MG 24 hr tablet Commonly known as: TOPROL -XL       TAKE these medications    apixaban  5 MG Tabs tablet Commonly known as: Eliquis  Take 1 tablet (5 mg total) by mouth 2 (two) times daily. What changed: how much to take   atorvastatin  20 MG tablet Commonly known as: LIPITOR TAKE 1 TABLET BY MOUTH ONCE DAILY    cetirizine  5 MG tablet Commonly known as: ZYRTEC  Take 1 tablet (5 mg total) by mouth daily.   CYANOCOBALAMIN IJ Inject 1,000 mcg as directed every 30 (thirty) days.   diltiazem  180 MG 24 hr capsule Commonly known as: CARDIZEM  CD Take 1 capsule (180 mg total) by mouth daily.   esomeprazole 40 MG capsule Commonly known as: NEXIUM Take 40 mg by mouth daily at 12 noon.   flecainide  100 MG tablet Commonly known as: TAMBOCOR  Take 1 tablet (100 mg total) by mouth 2 (two) times daily. What changed:  medication strength how much to take   fluticasone 50 MCG/ACT nasal spray Commonly known as: FLONASE Place 2 sprays into both nostrils daily.   furosemide  20 MG tablet Commonly known as: LASIX  Take 20 mg by mouth daily as needed for edema.   gabapentin  300 MG capsule Commonly known as: NEURONTIN  Take 300 mg by mouth at bedtime.   nitroGLYCERIN  0.4 MG SL tablet Commonly known as: NITROSTAT  DIS 1 T UNDER THE TONGUE Q 5 MINUTES PRF CHEST PAIN. MY TAKE UP TO 3 DOSES   oxybutynin  5 MG 24 hr tablet Commonly known as: DITROPAN -XL Take 5 mg by mouth at bedtime.   ProAir HFA 108 (90 Base) MCG/ACT inhaler Generic drug: albuterol INHALE 2 INHALATIONS INTO THE LUNGS EVERY 6 HOURS AS NEEDED        Contact information for follow-up providers     Callwood, Cara D, MD. Go in 1 week(s).   Specialties: Cardiology, Internal Medicine Why: 12/06/2024  2:30 PM Contact information: [address] [place] [address] [phone]         Melchor Agent, MD Follow up.   Specialty: Internal Medicine Why: EP appointment scheduled for 12/26/24 at 3 PM Contact information: [address] [place] [address] [phone]              Contact information for after-discharge care     Home Medical Care     [facility]) .   Service: Home Health Services Contact information: [address] [place]   [address] [phone]                     Discharge Exam: Cynthia Conley   11/25/24 0034  Weight: 72.6 kg   Blood pressure (!) 113/57, pulse (!) 56, temperature 98.5 F (36.9 C), temperature source Oral, resp. rate 15, height 5' 1 (1.549 m), weight 72.6 kg, last menstrual period 04/09/1975, SpO2 97%.   Gen: NAD, A&Ox3 HEENT: NCAT Neck: Supple, CV: Bradycardic, regular rhythm, no murmurs Resp: normal WOB, CTAB, no w/r/r Abd: Soft, NTND, no guarding Ext: No LE edema, pulses 2+ b/l Skin: Warm, dry, right upper extremity with bruising and bandaged wound  Neuro: No focal deficits Psych: Calm, cooperative, appropriate affect  Condition at discharge: good  The results of significant diagnostics from this hospitalization (including imaging, microbiology, ancillary and laboratory) are listed below for  reference.   Imaging Studies: DG Chest Port 1 View Result Date: 11/26/2024 EXAM: 1 VIEW(S) XRAY OF THE CHEST 11/26/2024 05:02:00 PM COMPARISON: Portable chest dated 11/25/2024 01:25 AM. CLINICAL HISTORY: Altered mental status. FINDINGS: LINES, TUBES AND DEVICES: Overlying telemetry leads. LUNGS AND PLEURA: Inspiration is somewhat improved today. There are bands of atelectasis in the lung bases. No focal pneumonia is evident. No pleural effusion. No pneumothorax. HEART AND MEDIASTINUM: Stable mild cardiomegaly is seen without evidence of CHF. There is calcification in the transverse aorta with stable mediastinum. BONES AND SOFT TISSUES: Osteopenia and thoracic spondylosis. IMPRESSION: 1. Bibasilar atelectasis without evidence for focal pneumonia. 2. Stable mild cardiomegaly without evidence of CHF. Electronically signed by: Francis Quam MD 11/26/2024 08:32 PM EST RP Workstation: [id number]   ECHOCARDIOGRAM COMPLETE Result Date: 11/26/2024    ECHOCARDIOGRAM REPORT   Patient Name:   [name] Date of Exam: 11/26/2024 Medical Rec #:  [id number]         Height:       61.0 in Accession  #:    [id number]        Weight:       160.0 lb Date of Birth:  Mar 12, 1935         BSA:          1.718 m Patient Age:    89 years          BP:           171/105 mmHg Patient Gender: F                 HR:           54 bpm. Exam Location:  Inpatient Procedure: 2D Echo (Both Spectral and Color Flow Doppler were utilized during            procedure). Indications:     Tricuspid valve disease. Atrial fibrillation.  History:         Patient has no prior history of Echocardiogram examinations.                  CAD, chronic kidney disease, Arrythmias:PVC; Risk                  Factors:Hypertension and Dyslipidemia.  Sonographer:     Tinnie Barefoot RDCS Referring Phys:  [id number] DUFFY AL-SULTANI Diagnosing Phys: Dwayne D Callwood MD IMPRESSIONS  1. Left ventricular ejection fraction, by estimation, is 65 to 70%. The left ventricle has normal function. The left ventricle has no regional wall motion abnormalities. Left ventricular diastolic parameters were normal.  2. Right ventricular systolic function is normal. The right ventricular size is normal. There is normal pulmonary artery systolic pressure.  3. The mitral valve is normal in structure. Moderate mitral valve regurgitation.  4. Tricuspid valve regurgitation is moderate.  5. The aortic valve is calcified. Aortic valve regurgitation is mild. Aortic valve sclerosis is present, with no evidence of aortic valve stenosis. FINDINGS  Left Ventricle: Left ventricular ejection fraction, by estimation, is 65 to 70%. The left ventricle has normal function. The left ventricle has no regional wall motion abnormalities. Strain was performed and the global longitudinal strain is indeterminate. The left ventricular internal cavity size was normal in size. There is no left ventricular hypertrophy. Left ventricular diastolic parameters were normal. Right Ventricle: The right ventricular size is normal. No increase in right ventricular wall thickness. Right ventricular systolic function  is normal. There is normal pulmonary artery systolic pressure. The tricuspid regurgitant velocity is  2.73 m/s, and  with an assumed right atrial pressure of 3 mmHg, the estimated right ventricular systolic pressure is 32.8 mmHg. Left Atrium: Left atrial size was normal in size. Right Atrium: Right atrial size was normal in size. Pericardium: There is no evidence of pericardial effusion. Mitral Valve: The mitral valve is normal in structure. Moderate mitral valve regurgitation. Tricuspid Valve: The tricuspid valve is grossly normal. Tricuspid valve regurgitation is moderate. Aortic Valve: The aortic valve is calcified. Aortic valve regurgitation is mild. Aortic valve sclerosis is present, with no evidence of aortic valve stenosis. Pulmonic Valve: The pulmonic valve was not well visualized. Pulmonic valve regurgitation is not visualized. Aorta: The aortic root was not well visualized. IAS/Shunts: No atrial level shunt detected by color flow Doppler. Additional Comments: 3D was performed not requiring image post processing on an independent workstation and was indeterminate.  LEFT VENTRICLE PLAX 2D LVIDd:         3.90 cm     Diastology LVIDs:         2.40 cm     LV e' medial:    7.94 cm/s LV IVS:        0.90 cm     LV E/e' medial:  11.4 LVOT diam:     1.90 cm     LV e' lateral:   7.29 cm/s LVOT Area:     2.84 cm    LV E/e' lateral: 12.4 LV IVRT:       95 msec  LV Volumes (MOD) LV vol d, MOD A2C: 71.6 ml LV vol d, MOD A4C: 65.7 ml LV vol s, MOD A2C: 22.5 ml LV vol s, MOD A4C: 22.4 ml LV SV MOD A2C:     49.1 ml LV SV MOD A4C:     65.7 ml LV SV MOD BP:      46.4 ml RIGHT VENTRICLE             IVC RV Basal diam:  3.20 cm     IVC diam: 1.90 cm RV S prime:     16.90 cm/s TAPSE (M-mode): 2.3 cm      PULMONARY VEINS                             Diastolic Velocity: 35.50 cm/s                             S/D Velocity:       1.70                             Systolic Velocity:  62.10 cm/s LEFT ATRIUM             Index        RIGHT  ATRIUM           Index LA diam:        3.00 cm 1.75 cm/m   RA Area:     15.80 cm LA Vol (A2C):   53.5 ml 31.14 ml/m  RA Volume:   39.70 ml  23.11 ml/m LA Vol (A4C):   48.2 ml 28.06 ml/m LA Biplane Vol: 52.5 ml 30.56 ml/m   AORTA Ao Root diam: 3.20 cm Ao Asc diam:  3.60 cm MITRAL VALVE                  TRICUSPID VALVE MV  Area (PHT): 3.27 cm       TR Peak grad:   29.8 mmHg MV Decel Time: 232 msec       TR Vmax:        273.00 cm/s MR Peak grad:    126.8 mmHg MR Mean grad:    86.0 mmHg    SHUNTS MR Vmax:         563.00 cm/s  Systemic Diam: 1.90 cm MR Vmean:        436.0 cm/s MR PISA:         0.57 cm MR PISA Eff ROA: 4 mm MR PISA Radius:  0.30 cm MV E velocity: 90.70 cm/s MV A velocity: 88.00 cm/s MV E/A ratio:  1.03 Dwayne D Callwood MD Electronically signed by Cara JONETTA Lovelace MD Signature Date/Time: 11/26/2024/2:01:21 PM    Final    DG Chest Port 1 View Result Date: 11/25/2024 EXAM: 1 VIEW(S) XRAY OF THE CHEST 11/25/2024 01:28:00 AM COMPARISON: 11/14/2024 CLINICAL HISTORY: Chest pain Chest pain Chest pain Chest pain FINDINGS: LUNGS AND PLEURA: Bibasilar opacities, favor atelectasis. No pleural effusion. No pneumothorax. HEART AND MEDIASTINUM: No acute abnormality of the cardiac and mediastinal silhouettes. BONES AND SOFT TISSUES: No acute osseous abnormality. IMPRESSION: 1. Bibasilar opacities, favored to represent atelectasis. Electronically signed by: Franky Crease MD 11/25/2024 01:35 AM EST RP Workstation: [id number]   DG Chest Port 1 View Result Date: 11/14/2024 EXAM: 1 VIEW(S) XRAY OF THE CHEST 11/14/2024 07:30:00 PM COMPARISON: None available. CLINICAL HISTORY: Shortness of breath. FINDINGS: LUNGS AND PLEURA: Patchy atelectasis at left lung base. No pleural effusion. No pneumothorax. HEART AND MEDIASTINUM: Aortic atherosclerosis. No acute abnormality of the cardiac and mediastinal silhouettes. BONES AND SOFT TISSUES: Left shoulder degenerative joint disease. IMPRESSION: 1. Patchy atelectasis at the  left lung base. Electronically signed by: Elsie Gravely MD 11/14/2024 07:35 PM EST RP Workstation: [id number]    Microbiology: Results for orders placed or performed during the hospital encounter of 11/14/24  Culture, blood (routine x 2)     Status: None   Collection Time: 11/14/24  9:19 PM   Specimen: BLOOD  Result Value Ref Range Status   Specimen Description BLOOD LEFT ANTECUBITAL  Final   Special Requests   Final    BOTTLES DRAWN AEROBIC AND ANAEROBIC Blood Culture adequate volume   Culture   Final    NO GROWTH 5 DAYS Performed at [facility], [address] [place], [place] [address]    Report Status 11/19/2024 FINAL  Final  Culture, blood (routine x 2)     Status: None   Collection Time: 11/14/24 10:50 PM   Specimen: BLOOD  Result Value Ref Range Status   Specimen Description BLOOD RIGHT ANTECUBITAL  Final   Special Requests   Final    BOTTLES DRAWN AEROBIC AND ANAEROBIC Blood Culture adequate volume   Culture   Final    NO GROWTH 5 DAYS Performed at [facility], [address] [place], [place] [address]    Report Status 11/19/2024 FINAL  Final    Labs: CBC: Recent Labs  Lab 11/26/24 0813 11/26/24 1748 11/27/24 0336 11/30/24 1113  WBC 5.7 6.9 5.3 5.6  NEUTROABS  --   --   --  4.0  HGB 10.3* 11.7* 10.9* 10.4*  HCT 30.1* 33.7* 31.9* 31.2*  MCV 90.4 90.6 91.7 92.9  PLT 180 212 174 207   Basic Metabolic Panel: Recent Labs  Lab 11/26/24 0813 11/27/24 0336 11/30/24 1113  NA 140 141 143  K 4.2 3.8 4.3  CL 110 109 110  CO2 21* 23 23  GLUCOSE 117* 92 97  BUN 9 11 15   CREATININE 1.13* 1.12* 1.21*  CALCIUM  8.9 8.8* 8.7*  MG 2.1  --   --   PHOS 2.8  --   --    Liver Function Tests: Recent Labs  Lab 11/30/24 1113  AST 20  ALT 15  ALKPHOS 71  BILITOT 0.3  PROT 5.6*  ALBUMIN 3.5   CBG: No results for input(s): GLUCAP in the last 168 hours.  Discharge time spent: Time Coordinating Discharge: I spent a total of 35 minutes  engaged in face-to-face discussion with the patient and/or caregivers regarding the patients care, assessment, plan, and discharge disposition. Over 50% of this time was dedicated to counseling the patient on the risks and benefits of treatment options and the discharge plan, as well as coordinating post-discharge care.   Signed: Delon Revelo Al-Sultani, MD [facility] 12/02/2024         "

## 2024-12-06 NOTE — Progress Notes (Signed)
 Established Patient Visit   Chief Complaint: Chief Complaint  Patient presents with   Hospital Follow Up   Date of Service: 12/06/2024 Date of Birth: December 27, 1934 PCP: Jeffie Cheryl Therman Mickey., MD  History of Present Illness: [name] is a 89 y.o.female patient who presents for a 6 month follow up. PMH significant for a-fib, palpitations, hypertension, CRI, edema, murmur, obesity, anxiety.  Patient for follow-up recently was seen in emergency room 123 with episodes of palpitation tachycardia possible atrial fibrillation EKG in emergency room showed sinus rhythm with controlled rate patient has been on Eliquis  Cardizem  with significant reasonable control.  She has had some lightheaded dizziness when she stands up and fell and also complains of some level of confusion especially when she is in the hospital or the emergency room she is reasonably clear when she is at home  Complains of anterior base of the neck upper chest discomfort slight burning not related to exertion relatively constant.  No blackout spells or syncope still has some lower extremity edema and lightheadedness.  Denies any falls     Past Medical and Surgical History  Past Medical History Past Medical History:  Diagnosis Date   Adenomatous colon polyp    Allergic rhinitis    B12 deficiency    B12 deficiency anemia    Benign neoplasm of large bowel    CAD (coronary artery disease)    Cervical stenosis of spine    Diverticula of colon    DJD (degenerative joint disease)    GERD (gastroesophageal reflux disease)    Heart disease    Hyperlipidemia    Hypertension    IBS (irritable bowel syndrome)    IGT (impaired glucose tolerance)    Mitral valve disorder    Osteopenia    Reflux esophagitis    Rheumatoid arthritis (CMS/HHS-HCC)    Scoliosis    Sleep apnea    on CPAP   Supraventricular tachycardia (HHS-HCC)    Valvular heart disease     Past Surgical History She has a past surgical  history that includes Cardiac catheterization; Hysterectomy; Cholecystectomy; Colonoscopy (01/31/2014); egd (01/31/2014); Colonoscopy (01/18/2003, 01/12/2006, 11/19/2008); and egd (07/05/2003, 11/19/2008).   Medications and Allergies  Current Medications  Current Outpatient Medications  Medication Sig Dispense Refill   apixaban  (ELIQUIS ) 5 mg tablet Take 1 tablet (5 mg total) by mouth every 12 (twelve) hours 180 tablet 3   atorvastatin  (LIPITOR) 20 MG tablet TAKE 1 TABLET BY MOUTH ONCE  DAILY 90 tablet 3   benzonatate (TESSALON) 200 MG capsule Take 1 capsule (200 mg total) by mouth 3 (three) times daily as needed for Cough 30 capsule 0   esomeprazole (NEXIUM) 40 MG DR capsule TAKE 1 CAPSULE BY MOUTH ONCE  DAILY 90 capsule 3   flecainide  (TAMBOCOR ) 50 MG tablet TAKE 1 TABLET BY MOUTH EVERY 12  HOURS 180 tablet 3   fluticasone propionate (FLONASE) 50 mcg/actuation nasal spray Place 2 sprays into both nostrils once daily 16 g 3   FUROsemide  (LASIX ) 20 MG tablet Take 1 tablet (20 mg total) by mouth once daily as needed for Edema 30 tablet 3   gabapentin  (NEURONTIN ) 300 MG capsule TAKE 1 CAPSULE BY MOUTH ONCE  DAILY 90 capsule 3   hydrOXYzine  (ATARAX ) 25 MG tablet Take 1 tablet (25 mg total) by mouth 3 (three) times daily as needed for Itching 30 tablet 0   metoprolol  SUCCinate (TOPROL -XL) 50 MG XL tablet Take 1 tablet (50 mg total) by mouth once daily 90 tablet 3  nitroGLYcerin  (NITROSTAT ) 0.4 MG SL tablet Place 1 tablet (0.4 mg total) under the tongue every 5 (five) minutes as needed for Chest pain May take up to 3 doses. 25 tablet 3   oxyBUTYnin  (DITROPAN -XL) 5 MG XL tablet TAKE 1 TABLET BY MOUTH ONCE  DAILY 90 tablet 3   triamcinolone 0.1 % cream Apply topically 2 (two) times daily as needed 80 g 0   Current Facility-Administered Medications  Medication Dose Route Frequency Provider Last Rate Last Admin   cyanocobalamin (VITAMIN B12) injection 1,000 mcg  1,000 mcg Intramuscular  Q30 Days Feldpausch, Cheryl Therman Raddle., MD   1,000 mcg at 09/06/24 1332    Allergies: Codeine and Hydrochlorothiazide  Social and Family History  Social History  reports that she has never smoked. She has never used smokeless tobacco. She reports that she does not drink alcohol and does not use drugs.  Family History Family History  Problem Relation Name Age of Onset   Myocardial Infarction (Heart attack) Mother     High blood pressure (Hypertension) Mother     Heart disease Mother     Kidney disease Mother     Myocardial Infarction (Heart attack) Father     High blood pressure (Hypertension) Father     Heart disease Father     Kidney disease Father     Rheum arthritis Other     Kidney disease Other      Review of Systems   Pertinent positives and negatives are mentioned above in HPI and all other systems are negative.  Physical Examination   Vitals:BP 120/78   Pulse 71   Resp 16   Ht 162.6 cm (5' 4)   Wt 68 kg (150 lb)   SpO2 98%   BMI 25.75 kg/m  Ht:162.6 cm (5' 4) Wt:68 kg (150 lb) ADJ:Anib surface area is 1.75 meters squared. Body mass index is 25.75 kg/m.  HEENT: Pupils equally reactive to light and accomodation  Neck: Supple without thyromegaly, carotid pulses 2+ Lungs: clear to auscultation bilaterally; no wheezes, rales, rhonchi Heart: Regular rate and rhythm.  No gallops, murmurs or rub Abdomen: soft nontender, nondistended, with normal bowel sounds Extremities: no cyanosis, clubbing, or edema Peripheral Pulses: 2+ in all extremities, 2+ femoral pulses bilaterally Neurologic: Alert and oriented X3; speech intact; face symmetrical; moves all extremities well  Cardiovascular Studies:    Echocardiogram 2D complete: 11/26/2024 IMPRESSIONS   1. Left ventricular ejection fraction, by estimation, is 65 to 70%. The left ventricle has normal function. The left ventricle has no regional wall motion abnormalities. Left ventricular diastolic parameters  were normal.   2. Right ventricular systolic function is normal. The right ventricular size is normal. There is normal pulmonary artery systolic pressure.   3. The mitral valve is normal in structure. Moderate mitral valve regurgitation.   4. Tricuspid valve regurgitation is moderate.   5. The aortic valve is calcified. Aortic valve regurgitation is mild. Aortic valve sclerosis is present, with no evidence of aortic valve stenosis.    NM Myocardial Perfusion SPECT multiple (stress and rest): 03/31/2022 FINDINGS: Regional wall motion:  reveals normal myocardial thickening and wall motion. The overall quality of the study is good.   Artifacts noted: no Left ventricular cavity: normal.   Perfusion Analysis:  SPECT images demonstrate homogeneous tracer distribution throughout the myocardium. Defect type : Normal      IMPRESSION: Normal Lexiscan infusion EKG Normal myocardial perfusion without evidence of myocardial ischemia   Wolm Rhyme   Cardiac Catheterization:  Holter:   Cardiac CT Scan:   Cardiac MRI:     Assessment   89 y.o. female with  1. Paroxysmal atrial fibrillation (CMS/HHS-HCC)   2. Palpitations   3. Vertigo   4. Essential hypertension   5. Mixed hyperlipidemia   6. Gastroesophageal reflux disease, unspecified whether esophagitis present        Plan  Lightheadedness intermittent recurrent avoid hypotension Atrial fibrillation paroxysmal continue Eliquis  for anticoagulation flecainide  for rhythm metoprolol  for rate refer to EP for further evaluation Palpitations metoprolol  therapy Flecainide  for arrhythmia management and control Hyperlipidemia agree with Lipitor therapy for lipid management GERD continue Nexium for reflux type symptoms patient appears to be well-controlled at manage Obstructive sleep apnea on CPAP modest weight loss follow-up with pulmonary Have the patient follow-up in 1 month     Return in about 1 month (around  01/05/2025).  This note is partially written by Leita Ellen, in the presence of and acting as the scribe of Dr. Cara Lovelace.      Leita Ellen  I have reviewed, edited and added to the note to reflect my best personal medical judgment.  Attestation Statement:   I personally performed the service. (TP)  DWAYNE JONETTA LOVELACE, MD  [facility] [place], [place] Ph:  [phone] Fax:  [phone] This note was generated in part with voice recognition software, Dragon.  I apologize for any typographical errors that were not detected and corrected from this process.  They are unintentional.

## 2024-12-11 ENCOUNTER — Emergency Department
Admission: EM | Admit: 2024-12-11 | Discharge: 2024-12-11 | Disposition: A | Discharge status: Home / Self Care | Attending: Emergency Medicine | Admitting: Emergency Medicine

## 2024-12-11 ENCOUNTER — Other Ambulatory Visit: Payer: Self-pay

## 2024-12-11 ENCOUNTER — Emergency Department

## 2024-12-11 DIAGNOSIS — I10 Essential (primary) hypertension: Secondary | ICD-10-CM | POA: Insufficient documentation

## 2024-12-11 DIAGNOSIS — I480 Paroxysmal atrial fibrillation: Secondary | ICD-10-CM | POA: Insufficient documentation

## 2024-12-11 LAB — BASIC METABOLIC PANEL WITH GFR
Anion gap: 14 (ref 5–15)
BUN: 17 mg/dL (ref 8–23)
CO2: 19 mmol/L — ABNORMAL LOW (ref 22–32)
Calcium: 9.2 mg/dL (ref 8.9–10.3)
Chloride: 104 mmol/L (ref 98–111)
Creatinine, Ser: 1.46 mg/dL — ABNORMAL HIGH (ref 0.44–1.00)
GFR, Estimated: 34 mL/min — ABNORMAL LOW
Glucose, Bld: 113 mg/dL — ABNORMAL HIGH (ref 70–99)
Potassium: 4.7 mmol/L (ref 3.5–5.1)
Sodium: 138 mmol/L (ref 135–145)

## 2024-12-11 LAB — TROPONIN T, HIGH SENSITIVITY
Troponin T High Sensitivity: 20 ng/L — ABNORMAL HIGH (ref 0–19)
Troponin T High Sensitivity: 18 ng/L (ref 0–19)

## 2024-12-11 LAB — CBC
HCT: 35.1 % — ABNORMAL LOW (ref 36.0–46.0)
Hemoglobin: 11.8 g/dL — ABNORMAL LOW (ref 12.0–15.0)
MCH: 31.3 pg (ref 26.0–34.0)
MCHC: 33.6 g/dL (ref 30.0–36.0)
MCV: 93.1 fL (ref 80.0–100.0)
Platelets: 250 10*3/uL (ref 150–400)
RBC: 3.77 MIL/uL — ABNORMAL LOW (ref 3.87–5.11)
RDW: 14.3 % (ref 11.5–15.5)
WBC: 7 10*3/uL (ref 4.0–10.5)
nRBC: 0 % (ref 0.0–0.2)

## 2024-12-11 MED ORDER — SODIUM CHLORIDE 0.9 % IV BOLUS
500.0000 mL | Freq: Once | INTRAVENOUS | Status: AC
  Administered 2024-12-11: 500 mL via INTRAVENOUS

## 2024-12-11 NOTE — Discharge Instructions (Addendum)
 Your workup in the emergency department showed normal results.  Please follow-up with your doctor.  Return to the emergency department for any symptom concerning to yourself or staff members.

## 2024-12-11 NOTE — ED Notes (Signed)
 Pt given DC instructions. Pt verbalized understanding of follow up care. Pt taken from ED in Wheelchair by this RN.
